# Patient Record
Sex: Female | Born: 1935 | Race: White | Hispanic: No | Marital: Married | Smoking: Never smoker
Health system: Southern US, Community
[De-identification: ages and names within clinical notes are randomized; demographics above are authoritative.]

## PROBLEM LIST (undated history)

## (undated) DIAGNOSIS — Z9889 Other specified postprocedural states: Secondary | ICD-10-CM

## (undated) DIAGNOSIS — Z8679 Personal history of other diseases of the circulatory system: Secondary | ICD-10-CM

## (undated) DIAGNOSIS — I10 Essential (primary) hypertension: Secondary | ICD-10-CM

## (undated) DIAGNOSIS — I495 Sick sinus syndrome: Secondary | ICD-10-CM

## (undated) DIAGNOSIS — D494 Neoplasm of unspecified behavior of bladder: Secondary | ICD-10-CM

## (undated) DIAGNOSIS — L719 Rosacea, unspecified: Secondary | ICD-10-CM

## (undated) HISTORY — DX: Personal history of other diseases of the circulatory system: Z86.79

## (undated) HISTORY — DX: Neoplasm of unspecified behavior of bladder: D49.4

## (undated) HISTORY — PX: ABDOMINAL HYSTERECTOMY: SHX81

## (undated) HISTORY — DX: Sick sinus syndrome: I49.5

## (undated) HISTORY — DX: Rosacea, unspecified: L71.9

## (undated) HISTORY — DX: Essential (primary) hypertension: I10

## (undated) HISTORY — DX: Other specified postprocedural states: Z98.890

---

## 2003-10-13 ENCOUNTER — Other Ambulatory Visit: Payer: Self-pay

## 2004-09-23 ENCOUNTER — Ambulatory Visit: Payer: Self-pay | Admitting: Internal Medicine

## 2007-05-03 ENCOUNTER — Ambulatory Visit: Payer: Self-pay | Admitting: Internal Medicine

## 2007-06-29 HISTORY — PX: PACEMAKER INSERTION: SHX728

## 2007-07-30 DIAGNOSIS — I495 Sick sinus syndrome: Secondary | ICD-10-CM

## 2007-07-30 HISTORY — DX: Sick sinus syndrome: I49.5

## 2007-08-10 ENCOUNTER — Other Ambulatory Visit: Payer: Self-pay

## 2007-08-10 ENCOUNTER — Inpatient Hospital Stay: Payer: Self-pay | Admitting: Internal Medicine

## 2007-08-14 ENCOUNTER — Other Ambulatory Visit: Payer: Self-pay

## 2007-08-15 ENCOUNTER — Other Ambulatory Visit: Payer: Self-pay

## 2008-11-14 ENCOUNTER — Inpatient Hospital Stay: Payer: Self-pay | Admitting: Internal Medicine

## 2009-01-09 ENCOUNTER — Encounter: Payer: Self-pay | Admitting: Internal Medicine

## 2009-01-26 DEATH — deceased

## 2009-02-07 ENCOUNTER — Ambulatory Visit: Payer: Self-pay | Admitting: Internal Medicine

## 2009-02-08 ENCOUNTER — Ambulatory Visit: Payer: Self-pay | Admitting: Internal Medicine

## 2009-02-17 ENCOUNTER — Ambulatory Visit: Payer: Self-pay | Admitting: Internal Medicine

## 2009-03-28 HISTORY — PX: CHOLECYSTECTOMY: SHX55

## 2009-04-24 ENCOUNTER — Inpatient Hospital Stay: Payer: Self-pay | Admitting: Internal Medicine

## 2009-05-09 ENCOUNTER — Encounter: Payer: Self-pay | Admitting: Internal Medicine

## 2009-05-28 ENCOUNTER — Encounter: Payer: Self-pay | Admitting: Internal Medicine

## 2011-01-01 ENCOUNTER — Encounter: Payer: Self-pay | Admitting: Internal Medicine

## 2011-01-27 ENCOUNTER — Encounter: Payer: Self-pay | Admitting: Internal Medicine

## 2011-02-27 ENCOUNTER — Encounter: Payer: Self-pay | Admitting: Internal Medicine

## 2011-03-10 ENCOUNTER — Other Ambulatory Visit: Payer: Self-pay | Admitting: Internal Medicine

## 2011-03-11 MED ORDER — MIRTAZAPINE 15 MG PO TABS: 15.0000 mg | ORAL_TABLET | Freq: Every day | ORAL | Status: DC

## 2011-03-13 ENCOUNTER — Other Ambulatory Visit: Payer: Self-pay | Admitting: Internal Medicine

## 2011-03-13 DIAGNOSIS — I10 Essential (primary) hypertension: Secondary | ICD-10-CM

## 2011-03-16 ENCOUNTER — Other Ambulatory Visit: Payer: Self-pay | Admitting: Internal Medicine

## 2011-03-27 ENCOUNTER — Other Ambulatory Visit: Payer: Self-pay | Admitting: Internal Medicine

## 2011-03-27 DIAGNOSIS — I10 Essential (primary) hypertension: Secondary | ICD-10-CM

## 2011-04-12 ENCOUNTER — Telehealth: Payer: Self-pay | Admitting: Internal Medicine

## 2011-04-12 NOTE — Telephone Encounter (Signed)
Patient would like samples for [place] met she is out or just Januvia with metformin prescription her brother Glendon Axe  will come by to pick up his number is [phone].

## 2011-04-12 NOTE — Telephone Encounter (Signed)
Erie Noe,  Can you get into the habit of checking patient's chart to see if we hae the dose of medication on file that patient is requesting?  It will save [place] days of going back and forth.  This is another patient who has not been abstracted yet so I don't know that dose of the medication she is requesting.  Thanks

## 2011-04-15 ENCOUNTER — Telehealth: Payer: Self-pay | Admitting: *Deleted

## 2011-04-15 NOTE — Telephone Encounter (Signed)
That is because her chart has not been abstracted yet.  This is the second time the patient has called, the first reply went to [place] who is out sick.  Please call the patient back and find out the dose she is taking and we will give her samples

## 2011-04-15 NOTE — Telephone Encounter (Signed)
Brother notified. He will come by and pick up the samples. Samples left up front for pick up.

## 2011-04-15 NOTE — Telephone Encounter (Signed)
Spoke with pt who advised me to call her son (bob). He takes care of pts meds.  Januvia 100 mg  [name] 50/500 mg

## 2011-04-15 NOTE — Telephone Encounter (Signed)
Morrie Sheldon,  Can you call ms Haugen brother [name] and tell him that we have 2 months of Januvia 100 mg samples for Claris Che? One tablet daily.   We do not have enough of the januvia/metformin samples to make his trip worthwhile so she will need to take generic metformin separately; the dose is  500 mg twice daily  #180 with refills, which she may already have at home.    Thanks,.  tt

## 2011-04-15 NOTE — Telephone Encounter (Signed)
Pts brother calling and requesting samples of Januvia or [name]. I do not see this medication on her list please Advise

## 2011-04-21 ENCOUNTER — Encounter: Payer: Self-pay | Admitting: Internal Medicine

## 2011-04-22 ENCOUNTER — Ambulatory Visit (INDEPENDENT_AMBULATORY_CARE_PROVIDER_SITE_OTHER): Payer: Medicare Other | Admitting: Internal Medicine

## 2011-04-22 ENCOUNTER — Encounter: Payer: Self-pay | Admitting: Internal Medicine

## 2011-04-22 DIAGNOSIS — N179 Acute kidney failure, unspecified: Secondary | ICD-10-CM

## 2011-04-22 DIAGNOSIS — E119 Type 2 diabetes mellitus without complications: Secondary | ICD-10-CM

## 2011-04-22 DIAGNOSIS — I959 Hypotension, unspecified: Secondary | ICD-10-CM

## 2011-04-22 DIAGNOSIS — Z23 Encounter for immunization: Secondary | ICD-10-CM

## 2011-04-22 DIAGNOSIS — I10 Essential (primary) hypertension: Secondary | ICD-10-CM

## 2011-04-22 DIAGNOSIS — Z79899 Other long term (current) drug therapy: Secondary | ICD-10-CM

## 2011-04-22 LAB — COMPREHENSIVE METABOLIC PANEL
CO2: 19 mEq/L (ref 19–32)
Creatinine, Ser: 1.6 mg/dL — ABNORMAL HIGH (ref 0.4–1.2)
GFR: 32.56 mL/min — ABNORMAL LOW (ref >60.00)
Glucose, Bld: 230 mg/dL — ABNORMAL HIGH (ref 70–99)
Sodium: 137 mEq/L (ref 135–145)
Total Bilirubin: 0.7 mg/dL (ref 0.3–1.2)
Total Protein: 7.1 g/dL (ref 6.0–8.3)

## 2011-04-22 NOTE — Patient Instructions (Addendum)
Stop the micardis and  continue lisinopril,  Diltiazem and metoprolol.   Resume metoprolol 2 tablets daily starting tomorrow if the top number stays above 150   Try increasing the mritazipine to 1.5 tablet or 2 tablets (if you can't cut them in half) at bedtime to stimulate appetite.

## 2011-04-23 LAB — DIGOXIN LEVEL: Digoxin Level: 0.5 ng/mL — ABNORMAL LOW (ref 0.8–2.0)

## 2011-04-25 ENCOUNTER — Telehealth: Payer: Self-pay | Admitting: Internal Medicine

## 2011-04-25 ENCOUNTER — Encounter: Payer: Self-pay | Admitting: Internal Medicine

## 2011-04-25 DIAGNOSIS — E1165 Type 2 diabetes mellitus with hyperglycemia: Secondary | ICD-10-CM | POA: Insufficient documentation

## 2011-04-25 DIAGNOSIS — N179 Acute kidney failure, unspecified: Secondary | ICD-10-CM | POA: Insufficient documentation

## 2011-04-25 DIAGNOSIS — Z8679 Personal history of other diseases of the circulatory system: Secondary | ICD-10-CM | POA: Insufficient documentation

## 2011-04-25 DIAGNOSIS — I10 Essential (primary) hypertension: Secondary | ICD-10-CM | POA: Insufficient documentation

## 2011-04-25 NOTE — Progress Notes (Signed)
Subjective:    Patient ID: Alexandria Macias, female    DOB: [birth year], 77 y.o.   MRN: [id number]  HPI  Alexandria Macias is a 77 yr od white female with a history of DM , poorly controlled for there past 2 yrs due to patient refusal to use insulin,  Hypertension,  Sick sinus syndrome s.p pacemaker insertion 2009, chronic venous insufficiency with LE edema, who presents for general followup on chronic issues .  She continues ot have fatigeu and deconditioning, light headedness without true vertigo.  She livers at home with her elderly sister and uses a walker occasionally.  No recent falls.  Her brother Alexandria Macias lives nearby and is in constant attendance for trasnportation to all physician visits and grocery shopping, etc.  Patient has refused alternative living situation and home health nursing, so control of diabetes has been difficult since neither she nor her sister will administer insulin or change their diet to a carbohydrate restricted diet.  She relies on samples of januvia due to limited income, but can afford generic medications. Sugars have been running up to 160 in the morning and to 180 in the evening, with no lows reported.  Has been up to date with eye exams.  Contineus to have poor appetite, "nothing tastes good" without sinus pain, congestion or discharge. Past Medical History  Diagnosis Date  . 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 Sick sinus syndrome Feb 2009    pacer implant  . Hx of cardiac catheterization     right and left heart  . Rosacea   . Hypertension   . Diabetes mellitus   . History of sick sinus syndrome    Current Outpatient Prescriptions on File Prior to Visit  Medication Sig Dispense Refill  . digoxin (LANOXIN) 0.125 MG tablet Take 125 mcg by mouth every other day.        . diltiazem (CARDIZEM CD) 180 MG 24 hr capsule Take 360 mg by mouth daily.        Marland Kitchen glipiZIDE (GLUCOTROL) 10 MG tablet Take 10 mg by mouth 2 (two) times daily before a meal.        .  lisinopril (PRINIVIL,ZESTRIL) 40 MG tablet Take 40 mg by mouth daily.        . metoprolol (TOPROL-XL) 100 MG 24 hr tablet TAKE TWO TABLETS BY MOUTH EVERY DAY IN THE MORNING FOR BLOOD PRESSURE  60 tablet  5  . MICARDIS 80 MG tablet TAKE ONE TABLET BY MOUTH EVERY DAY  30 each  5  . mirtazapine (REMERON) 15 MG tablet Take 1 tablet (15 mg total) by mouth at bedtime.  30 tablet  3    .  Review of Systems  Constitutional: Positive for appetite change and fatigue.  HENT: Negative.   Eyes: Negative.   Respiratory: Negative.   Cardiovascular: Positive for leg swelling.  Gastrointestinal: Negative.   Genitourinary: Negative.   Neurological: Positive for weakness and light-headedness. Negative for tremors, syncope and speech difficulty.  Hematological: Negative.   Psychiatric/Behavioral: Negative.       BP 106/60  Pulse 97  Temp(Src) 98.2 F (36.8 C) (Oral)  Resp 16  Ht 5' 3.5" (1.613 m)  Wt 144 lb 4 oz (65.431 kg)  BMI 25.15 kg/m2  SpO2 97%  Objective:   Physical Exam  Constitutional: She is oriented to person, place, and time. She appears well-developed and well-nourished.  HENT:  Mouth/Throat: Oropharynx is clear and moist.  Eyes: EOM are normal.  Pupils are equal, round, and reactive to light. No scleral icterus.  Neck: Normal range of motion. Neck supple. No JVD present. No thyromegaly present.  Cardiovascular: Normal rate, regular rhythm, normal heart sounds and intact distal pulses.   Pulmonary/Chest: Effort normal and breath sounds normal.  Abdominal: Soft. Bowel sounds are normal. She exhibits no mass. There is no tenderness.  Musculoskeletal: Normal range of motion. She exhibits edema.  Lymphadenopathy:    She has no cervical adenopathy.  Neurological: She is alert and oriented to person, place, and time.  Skin: Skin is warm and dry.  Psychiatric: She has a normal mood and affect.          Assessment & Plan:

## 2011-04-25 NOTE — Telephone Encounter (Signed)
pls let Alexandria Macias known that Alexandria Macias's kidney function is down by recent labs,  I want her to stop the micardis and lisinopril for two weeks and return for blood work in 2 weeks.

## 2011-04-25 NOTE — Assessment & Plan Note (Signed)
With bump in cr from 0.8 previously.  Will stop the micardis and repeat a BMET in 2 weeks.

## 2011-04-25 NOTE — Assessment & Plan Note (Addendum)
suboptimal control on januvia, glipizide and metformin, but improved from prior hgba1c of 12 in January.  Patient and family refuse to use insulin or any medication requiring needles.  hgba1c due,  samples given. Reminded to continue annual eye exams.

## 2011-04-25 NOTE — Assessment & Plan Note (Addendum)
She appears to be over medicaed currently, abd her cr is elevated from prior Cr of 0.8 in January .  Will stop the micardis for now and monitor home pressures.  Repeat Cr in 3 months

## 2011-04-26 NOTE — Telephone Encounter (Signed)
Notified Alexandria Macias of the message he will bring patient back in 2 weeks for additional labs.

## 2011-05-22 ENCOUNTER — Other Ambulatory Visit: Payer: Self-pay | Admitting: Internal Medicine

## 2011-06-01 ENCOUNTER — Telehealth: Payer: Self-pay | Admitting: Internal Medicine

## 2011-06-01 MED ORDER — MIRTAZAPINE 45 MG PO TABS: ORAL_TABLET | ORAL | Status: AC

## 2011-06-01 NOTE — Telephone Encounter (Signed)
Patient's brother notified   

## 2011-06-01 NOTE — Telephone Encounter (Signed)
It was in the patient instructions.  Chart now updated correctly.   I sent a new rx for 45 mg tablet : 1/2 tablet daily  At bedtime to pharmacy via  e prescribe

## 2011-06-01 NOTE — Telephone Encounter (Signed)
Patients brother called and stated at patients last office you increased her mirtazepine dose to one and a half tablets a day.  He stated she is out of the medication because she only had a 30 day supply.  I did not see in her chart where you increased the medication.  Please advise.

## 2011-06-02 ENCOUNTER — Encounter: Payer: Self-pay | Admitting: Internal Medicine

## 2011-07-10 ENCOUNTER — Other Ambulatory Visit: Payer: Self-pay | Admitting: Internal Medicine

## 2011-07-17 ENCOUNTER — Other Ambulatory Visit: Payer: Self-pay | Admitting: Internal Medicine

## 2011-07-22 ENCOUNTER — Encounter: Payer: Self-pay | Admitting: Internal Medicine

## 2011-07-22 ENCOUNTER — Ambulatory Visit (INDEPENDENT_AMBULATORY_CARE_PROVIDER_SITE_OTHER): Payer: Medicare Other | Admitting: Internal Medicine

## 2011-07-22 DIAGNOSIS — E875 Hyperkalemia: Secondary | ICD-10-CM

## 2011-07-22 DIAGNOSIS — I10 Essential (primary) hypertension: Secondary | ICD-10-CM

## 2011-07-22 DIAGNOSIS — E119 Type 2 diabetes mellitus without complications: Secondary | ICD-10-CM

## 2011-07-22 DIAGNOSIS — Z8679 Personal history of other diseases of the circulatory system: Secondary | ICD-10-CM

## 2011-07-22 DIAGNOSIS — R5383 Other fatigue: Secondary | ICD-10-CM

## 2011-07-22 LAB — COMPREHENSIVE METABOLIC PANEL
ALT: 17 U/L (ref 0–35)
AST: 18 U/L (ref 0–37)
Alkaline Phosphatase: 71 U/L (ref 39–117)
Calcium: 9.5 mg/dL (ref 8.4–10.5)
Chloride: 105 mEq/L (ref 96–112)
Creatinine, Ser: 1.4 mg/dL — ABNORMAL HIGH (ref 0.4–1.2)

## 2011-07-22 LAB — CBC WITH DIFFERENTIAL/PLATELET
Basophils Relative: 0.3 % (ref 0.0–3.0)
Eosinophils Absolute: 0.1 10*3/uL (ref 0.0–0.7)
Eosinophils Relative: 1.7 % (ref 0.0–5.0)
HCT: 37.8 % (ref 36.0–46.0)
Hemoglobin: 12.6 g/dL (ref 12.0–15.0)
MCHC: 33.4 g/dL (ref 30.0–36.0)
MCV: 93.1 fl (ref 78.0–100.0)
Monocytes Absolute: 0.5 10*3/uL (ref 0.1–1.0)
Neutro Abs: 6.2 10*3/uL (ref 1.4–7.7)
RBC: 4.07 Mil/uL (ref 3.87–5.11)
WBC: 8.1 10*3/uL (ref 4.5–10.5)

## 2011-07-22 NOTE — Progress Notes (Signed)
Subjective:    Patient ID: Alexandria Macias, female    DOB: 01/05/1918, 78 y.o.   MRN: [id number]  HPI  Alexandria Macias is a delightful 78 year old white female with a history of diabetes mellitus hypertension and atrial fibrillation. She continues to live at home with her sister Alexandria Macias. She continues to drive but has route recently decided to [employer] driver's driving license because she has noted that she becomes confused at times when she is driving and has trouble remembering how to get a certain places. She does not having difficulty going back and forth to the drug store or to the grocery store. She's had no recent accidents and her her brother Alexandria Macias confer Z. there are no new dense on her car. Today she feels tired and thinks that she has mild dehydration. At her last visit she was urged to increase her fluid intake and several her blood pressure medications were discontinued at that time. Her brother Alexandria Macias notes that since we discontinued her blood pressure medications she has done much better and has had more energy and felt less worn out.    Past Medical History  Diagnosis Date  . 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 Sick sinus syndrome Feb 2009    pacer implant  . Hx of cardiac catheterization     right and left heart  . Rosacea   . Hypertension   . Diabetes mellitus   . History of sick sinus syndrome    Current Outpatient Prescriptions on File Prior to Visit  Medication Sig Dispense Refill  . digoxin (LANOXIN) 0.125 MG tablet Take 125 mcg by mouth every other day.        . diltiazem (CARDIZEM CD) 180 MG 24 hr capsule TAKE TWO CAPSULES BY MOUTH EVERY DAY FOR HEART RATE  180 capsule  3  . glipiZIDE (GLUCOTROL) 10 MG tablet TAKE ONE TABLET BY MOUTH TWICE DAILY WITH MEALS  60 tablet  6  . metFORMIN (GLUCOPHAGE) 500 MG tablet TAKE ONE TABLET BY MOUTH TWICE DAILY  60 tablet  11  . mirtazapine (REMERON) 45 MG tablet 1/2  tablet daily at bedtime  30 tablet  3  . sitaGLIPtin  (JANUVIA) 100 MG tablet Take 100 mg by mouth daily.        . metoprolol (TOPROL-XL) 100 MG 24 hr tablet TAKE TWO TABLETS BY MOUTH EVERY DAY IN THE MORNING FOR BLOOD PRESSURE  60 tablet  5    Review of Systems  Constitutional: Positive for appetite change and fatigue.  HENT: Negative.   Eyes: Negative.   Respiratory: Negative.   Cardiovascular: Positive for leg swelling.  Gastrointestinal: Positive for abdominal distention.  Genitourinary: Negative.   Neurological: Positive for weakness. Negative for tremors, syncope, speech difficulty and light-headedness.  Hematological: Negative.   Psychiatric/Behavioral: Negative.        Objective:   Physical Exam  Constitutional: She is oriented to person, place, and time. She appears well-developed and well-nourished.  HENT:  Mouth/Throat: Oropharynx is clear and moist.  Eyes: EOM are normal. Pupils are equal, round, and reactive to light. No scleral icterus.  Neck: Normal range of motion. Neck supple. No JVD present. No thyromegaly present.  Cardiovascular: Normal rate, regular rhythm, normal heart sounds and intact distal pulses.   Pulmonary/Chest: Effort normal and breath sounds normal.  Abdominal: Soft. Bowel sounds are normal. She exhibits no mass. There is no tenderness.  Musculoskeletal: Normal range of motion. She exhibits no edema.  Lymphadenopathy:  She has no cervical adenopathy.  Neurological: She is alert and oriented to person, place, and time.  Skin: Skin is warm and dry.  Psychiatric: She has a normal mood and affect.      Assessment & Plan:   Hypertension At her last visit her Lorcet losartan and lisinopril were discontinued because of hypotension and hyperkalemia. Repeat blood pressure stays high and she remains slightly hyperkalemic but is on no medications that can be causing this. Renal function is stable. No changes today  History of sick sinus syndrome Her heart rate remains well controlled on current regimen.  She is status post pacemaker for sick sinus syndrome.  Diabetes mellitus Her diabetes remains uncontrolled due to her inability to self and entered in his insulin. We have decided to stop checking hemoglobin A1c use since there are no plans to start insulin.    Updated Medication List Outpatient Encounter Prescriptions as of 07/22/2011  Medication Sig Dispense Refill  . digoxin (LANOXIN) 0.125 MG tablet Take 125 mcg by mouth every other day.        . diltiazem (CARDIZEM CD) 180 MG 24 hr capsule TAKE TWO CAPSULES BY MOUTH EVERY DAY FOR HEART RATE  180 capsule  3  . glipiZIDE (GLUCOTROL) 10 MG tablet TAKE ONE TABLET BY MOUTH TWICE DAILY WITH MEALS  60 tablet  6  . metFORMIN (GLUCOPHAGE) 500 MG tablet TAKE ONE TABLET BY MOUTH TWICE DAILY  60 tablet  11  . mirtazapine (REMERON) 45 MG tablet 1/2  tablet daily at bedtime  30 tablet  3  . sitaGLIPtin (JANUVIA) 100 MG tablet Take 100 mg by mouth daily.        . metoprolol (TOPROL-XL) 100 MG 24 hr tablet TAKE TWO TABLETS BY MOUTH EVERY DAY IN THE MORNING FOR BLOOD PRESSURE  60 tablet  5  . DISCONTD: lisinopril (PRINIVIL,ZESTRIL) 40 MG tablet Take 40 mg by mouth daily.        Marland Kitchen DISCONTD: MICARDIS 80 MG tablet TAKE ONE TABLET BY MOUTH EVERY DAY  30 each  5

## 2011-07-23 NOTE — Assessment & Plan Note (Signed)
At her last visit her Lorcet losartan and lisinopril were discontinued because of hypotension and hyperkalemia. Repeat blood pressure stays high and she remains slightly hyperkalemic but is on no medications that can be causing this. Renal function is stable. No changes today

## 2011-07-23 NOTE — Assessment & Plan Note (Signed)
Her heart rate remains well controlled on current regimen. She is status post pacemaker for sick sinus syndrome.

## 2011-07-23 NOTE — Assessment & Plan Note (Signed)
Her diabetes remains uncontrolled due to her inability to self and entered in his insulin. We have decided to stop checking hemoglobin A1c use since there are no plans to start insulin.

## 2011-08-18 ENCOUNTER — Other Ambulatory Visit: Payer: Self-pay | Admitting: Internal Medicine

## 2011-11-26 ENCOUNTER — Encounter: Payer: Self-pay | Admitting: Internal Medicine

## 2011-11-26 ENCOUNTER — Ambulatory Visit (INDEPENDENT_AMBULATORY_CARE_PROVIDER_SITE_OTHER): Payer: Medicare Other | Admitting: Internal Medicine

## 2011-11-26 VITALS — BP 142/64 | HR 92 | Temp 97.9°F | Resp 14 | Wt 150.5 lb

## 2011-11-26 DIAGNOSIS — E119 Type 2 diabetes mellitus without complications: Secondary | ICD-10-CM

## 2011-11-26 DIAGNOSIS — IMO0002 Reserved for concepts with insufficient information to code with codable children: Secondary | ICD-10-CM

## 2011-11-26 DIAGNOSIS — E1165 Type 2 diabetes mellitus with hyperglycemia: Secondary | ICD-10-CM

## 2011-11-26 DIAGNOSIS — IMO0001 Reserved for inherently not codable concepts without codable children: Secondary | ICD-10-CM

## 2011-11-26 DIAGNOSIS — R350 Frequency of micturition: Secondary | ICD-10-CM

## 2011-11-26 DIAGNOSIS — E559 Vitamin D deficiency, unspecified: Secondary | ICD-10-CM

## 2011-11-26 DIAGNOSIS — N189 Chronic kidney disease, unspecified: Secondary | ICD-10-CM

## 2011-11-26 DIAGNOSIS — E1122 Type 2 diabetes mellitus with diabetic chronic kidney disease: Secondary | ICD-10-CM

## 2011-11-26 DIAGNOSIS — N058 Unspecified nephritic syndrome with other morphologic changes: Secondary | ICD-10-CM

## 2011-11-26 DIAGNOSIS — R5383 Other fatigue: Secondary | ICD-10-CM

## 2011-11-26 DIAGNOSIS — R5381 Other malaise: Secondary | ICD-10-CM

## 2011-11-26 DIAGNOSIS — E1129 Type 2 diabetes mellitus with other diabetic kidney complication: Secondary | ICD-10-CM

## 2011-11-26 DIAGNOSIS — E538 Deficiency of other specified B group vitamins: Secondary | ICD-10-CM

## 2011-11-26 LAB — POCT URINALYSIS DIPSTICK
Protein, UA: 30
Spec Grav, UA: 1.025
Urobilinogen, UA: 0.2

## 2011-11-26 MED ORDER — LINAGLIPTIN-METFORMIN HCL 2.5-1000 MG PO TABS: 1.0000 | ORAL_TABLET | Freq: Every day | ORAL | Status: DC

## 2011-11-26 MED ORDER — CYANOCOBALAMIN 1000 MCG/ML IJ SOLN
1000.0000 ug | Freq: Once | INTRAMUSCULAR | Status: AC
  Administered 2011-11-26: 1000 ug via INTRAMUSCULAR

## 2011-11-26 NOTE — Patient Instructions (Signed)
I have given you samples of a medicine  To take for your diabetes when you run out of the [place].  The new medicine already has the metformin in it , so stop your metformin.

## 2011-11-26 NOTE — Progress Notes (Signed)
Patient ID: Alexandria Macias, female   DOB: 12-08-1917, 78 y.o.   MRN: [id number] Patient Active Problem List  Diagnoses  . Hypertension  . Diabetes mellitus type 2, uncontrolled  . History of sick sinus syndrome  . Acute renal failure  . Fatigue  . Chronic kidney disease in type 2 diabetes mellitus    Subjective:  CC:   Chief Complaint  Patient presents with  . Follow-up    HPI:   Alexandria Macias a 78 y.o. female who presents  For follow up on diabetes .  She continues to live at home with her sister and husband. Reports excessive fatigue, worried she has a thyroid disease since she has noticed her fingernails becoming brittle and breaking easily, along with hair loss, weakness, dry skin.  Her blood sugars have been elevated lately in the 220 to 300 range , checked once daily .  No lows,,  Using only oral medications.  Past Medical History  Diagnosis Date  . 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 Sick sinus syndrome Feb 2009    pacer implant  . Hx of cardiac catheterization     right and left heart  . Rosacea   . Hypertension   . Diabetes mellitus   . History of sick sinus syndrome     Past Surgical History  Procedure Date  . Abdominal hysterectomy   . Pacemaker insertion 2009  . Cholecystectomy Oct 2010    chronic cholecystitis         The following portions of the patient's history were reviewed and updated as appropriate: Allergies, current medications, and problem list.    Review of Systems:   12 Pt  review of systems was negative except those addressed in the HPI,     History   Social History  . Marital Status: Widowed    Spouse Name: N/A    Number of Children: N/A  . Years of Education: N/A   Occupational History  . Not on file.   Social History Main Topics  . Smoking status: Never Smoker   . Smokeless tobacco: Never Used  . Alcohol Use: No  . Drug Use: No  . Sexually Active: Not on file   Other Topics Concern  . Not  on file   Social History Narrative  . No narrative on file    Objective:  BP 142/64  Pulse 92  Temp(Src) 97.9 F (36.6 C) (Oral)  Resp 14  Wt 150 lb 8 oz (68.266 kg)  SpO2 97%  General appearance: alert, cooperative and appears stated age Ears: normal TM's and external ear canals both ears Throat: lips, mucosa, and tongue normal; teeth and gums normal Neck: no adenopathy, no carotid bruit, supple, symmetrical, trachea midline and thyroid not enlarged, symmetric, no tenderness/mass/nodules Back: symmetric, no curvature. ROM normal. No CVA tenderness. Lungs: clear to auscultation bilaterally Heart: regular rate and rhythm, S1, S2 normal, no murmur, click, rub or gallop Abdomen: soft, non-tender; bowel sounds normal; no masses,  no organomegaly Pulses: 2+ and symmetric Skin: Skin color, texture, turgor normal. No rashes or lesions Lymph nodes: Cervical, supraclavicular, and axillary nodes normal.  Assessment and Plan:  Fatigue With other signs of hypothyroidism but normal TSH in the past.  Will repeat per patient request.   Diabetes mellitus type 2, uncontrolled suboptimal control secondary to inability to administer insulin.  She is on maximal doses of metformin and glipizide,  The Januvia is costing over $100 er month.  Sample  of an alternative gliptin given today , 3 month supply.   Chronic kidney disease in type 2 diabetes mellitus Stable function by repeat labs.     Updated Medication List Outpatient Encounter Prescriptions as of 11/26/2011  Medication Sig Dispense Refill  . digoxin (LANOXIN) 0.125 MG tablet Take 125 mcg by mouth every other day.        . diltiazem (CARDIZEM CD) 180 MG 24 hr capsule TAKE TWO CAPSULES BY MOUTH EVERY DAY FOR HEART RATE  180 capsule  3  . glipiZIDE (GLUCOTROL) 10 MG tablet TAKE ONE TABLET BY MOUTH TWICE DAILY WITH MEALS  60 tablet  6  . mirtazapine (REMERON) 45 MG tablet 1/2  tablet daily at bedtime  30 tablet  3  . DISCONTD: JANUVIA 100  MG tablet TAKE ONE TABLET BY MOUTH EVERY DAY  30 each  6  . DISCONTD: metFORMIN (GLUCOPHAGE) 500 MG tablet TAKE ONE TABLET BY MOUTH TWICE DAILY  60 tablet  11  . Linagliptin-Metformin HCl (JENTADUETO) 2.10-998 MG TABS Take 1 tablet by mouth daily.  90 tablet  0  . DISCONTD: metoprolol (TOPROL-XL) 100 MG 24 hr tablet TAKE TWO TABLETS BY MOUTH EVERY DAY IN THE MORNING FOR BLOOD PRESSURE  60 tablet  5  . cyanocobalamin ((VITAMIN B-12)) injection 1,000 mcg          Orders Placed This Encounter  Procedures  . Urine Culture  . Comp Met (CMET)  . TSH  . Hemoglobin A1c  . CBC with Differential  . Magnesium  . Vitamin D 25 hydroxy  . Digoxin level  . POCT urinalysis dipstick    No Follow-up on file.

## 2011-11-27 LAB — CBC WITH DIFFERENTIAL/PLATELET
Basophils Absolute: 0 10*3/uL (ref 0.0–0.1)
Basophils Relative: 0 % (ref 0–1)
Eosinophils Absolute: 0.1 10*3/uL (ref 0.0–0.7)
Eosinophils Relative: 1 % (ref 0–5)
HCT: 40.1 % (ref 36.0–46.0)
MCH: 30.2 pg (ref 26.0–34.0)
MCHC: 32.9 g/dL (ref 30.0–36.0)
MCV: 91.8 fL (ref 78.0–100.0)
Monocytes Absolute: 0.6 10*3/uL (ref 0.1–1.0)
Platelets: 207 10*3/uL (ref 150–400)
RDW: 14.1 % (ref 11.5–15.5)
WBC: 7.9 10*3/uL (ref 4.0–10.5)

## 2011-11-27 LAB — COMPREHENSIVE METABOLIC PANEL
ALT: 13 U/L (ref 0–35)
AST: 21 U/L (ref 0–37)
Albumin: 4.1 g/dL (ref 3.5–5.2)
CO2: 17 mEq/L — ABNORMAL LOW (ref 19–32)
Calcium: 9.9 mg/dL (ref 8.4–10.5)
Chloride: 101 mEq/L (ref 96–112)
Creat: 1.51 mg/dL — ABNORMAL HIGH (ref 0.50–1.10)
Potassium: 4.9 mEq/L (ref 3.5–5.3)

## 2011-11-27 LAB — TSH: TSH: 3.85 u[IU]/mL (ref 0.350–4.500)

## 2011-11-28 ENCOUNTER — Encounter: Payer: Self-pay | Admitting: Internal Medicine

## 2011-11-28 DIAGNOSIS — E1122 Type 2 diabetes mellitus with diabetic chronic kidney disease: Secondary | ICD-10-CM | POA: Insufficient documentation

## 2011-11-28 DIAGNOSIS — R5383 Other fatigue: Secondary | ICD-10-CM | POA: Insufficient documentation

## 2011-11-28 LAB — URINE CULTURE
Colony Count: NO GROWTH
Organism ID, Bacteria: NO GROWTH

## 2011-11-28 NOTE — Assessment & Plan Note (Signed)
With other signs of hypothyroidism but normal TSH in the past.  Will repeat per patient request.

## 2011-11-28 NOTE — Assessment & Plan Note (Signed)
Stable function by repeat labs.

## 2011-11-28 NOTE — Assessment & Plan Note (Addendum)
suboptimal control secondary to inability to administer insulin.  She is on maximal doses of metformin and glipizide,  The Januvia is costing over $100 er month.  Sample of an alternative gliptin given today , 3 month supply.

## 2011-11-30 NOTE — Progress Notes (Signed)
Addended by: Duncan Dull on: 11/30/2011 01:19 PM   Modules accepted: Orders

## 2011-12-06 ENCOUNTER — Telehealth: Payer: Self-pay | Admitting: Internal Medicine

## 2011-12-06 NOTE — Telephone Encounter (Signed)
Selena Batten is scheduled to see patient this Friday 12/10/11 at 10:00.

## 2011-12-31 ENCOUNTER — Telehealth: Payer: Self-pay | Admitting: Internal Medicine

## 2011-12-31 NOTE — Telephone Encounter (Signed)
[name] has two choices,  He can increase the jentaduetto to two tablets daily,  Or resume the Januvia once daily and add the metformin back twice daily

## 2011-12-31 NOTE — Telephone Encounter (Signed)
I spoke with [name] he is going to give patient the Jentadueto two tablets daily.

## 2011-12-31 NOTE — Telephone Encounter (Signed)
Caller: Bob/Sibling; Phone Number: [phone]; Message from caller: Brother calling today 12/31/11 regarding Dr.  Darrick Huntsman changed his sister's blood sugar medicine on 12/19/11 to [facility], since that time her blood sugars have been running about 100 more than normal.  Has been as high as 354 - 2 days ago.  Brother has taken her off this and put her back on the Januvia 100 mg once per day.  Has not been taking the Metformin 500 mg BID.  Wants to know if she should go back on the Metformin or what Dr.  Darrick Huntsman advises from here.  Blood sugar this AM was 260 which was 3 hours after breakfast.  PLEASE CALL BROTHER BACK AT [phone] TO ADVISE.

## 2012-01-08 ENCOUNTER — Other Ambulatory Visit: Payer: Self-pay | Admitting: Internal Medicine

## 2012-02-12 ENCOUNTER — Other Ambulatory Visit: Payer: Self-pay | Admitting: Internal Medicine

## 2012-04-29 ENCOUNTER — Other Ambulatory Visit: Payer: Self-pay | Admitting: Internal Medicine

## 2012-05-02 NOTE — Telephone Encounter (Signed)
Refill request for Januvia 100 mg # 30 1 R was sent to [facility] pharmacy patient was called and advised to make a follow up appt for Diabetes.

## 2012-05-13 ENCOUNTER — Other Ambulatory Visit: Payer: Self-pay | Admitting: Internal Medicine

## 2012-05-15 NOTE — Telephone Encounter (Signed)
Reordered Cardizem CD 180 mg per [employer]

## 2012-07-01 ENCOUNTER — Other Ambulatory Visit: Payer: Self-pay | Admitting: Internal Medicine

## 2012-07-07 ENCOUNTER — Ambulatory Visit (INDEPENDENT_AMBULATORY_CARE_PROVIDER_SITE_OTHER): Payer: Medicare Other | Admitting: Internal Medicine

## 2012-07-07 ENCOUNTER — Telehealth: Payer: Self-pay | Admitting: *Deleted

## 2012-07-07 ENCOUNTER — Encounter: Payer: Self-pay | Admitting: Internal Medicine

## 2012-07-07 VITALS — BP 136/84 | HR 89 | Temp 97.9°F | Resp 16 | Wt 146.5 lb

## 2012-07-07 DIAGNOSIS — E1129 Type 2 diabetes mellitus with other diabetic kidney complication: Secondary | ICD-10-CM

## 2012-07-07 DIAGNOSIS — K5900 Constipation, unspecified: Secondary | ICD-10-CM

## 2012-07-07 DIAGNOSIS — R634 Abnormal weight loss: Secondary | ICD-10-CM

## 2012-07-07 DIAGNOSIS — Z1331 Encounter for screening for depression: Secondary | ICD-10-CM

## 2012-07-07 DIAGNOSIS — Z8679 Personal history of other diseases of the circulatory system: Secondary | ICD-10-CM

## 2012-07-07 DIAGNOSIS — I10 Essential (primary) hypertension: Secondary | ICD-10-CM

## 2012-07-07 DIAGNOSIS — IMO0001 Reserved for inherently not codable concepts without codable children: Secondary | ICD-10-CM

## 2012-07-07 DIAGNOSIS — E559 Vitamin D deficiency, unspecified: Secondary | ICD-10-CM

## 2012-07-07 DIAGNOSIS — E1165 Type 2 diabetes mellitus with hyperglycemia: Secondary | ICD-10-CM

## 2012-07-07 DIAGNOSIS — IMO0002 Reserved for concepts with insufficient information to code with codable children: Secondary | ICD-10-CM

## 2012-07-07 DIAGNOSIS — E1122 Type 2 diabetes mellitus with diabetic chronic kidney disease: Secondary | ICD-10-CM

## 2012-07-07 DIAGNOSIS — N189 Chronic kidney disease, unspecified: Secondary | ICD-10-CM

## 2012-07-07 LAB — COMPREHENSIVE METABOLIC PANEL
ALT: 13 U/L (ref 0–35)
AST: 18 U/L (ref 0–37)
Albumin: 3.8 g/dL (ref 3.5–5.2)
Calcium: 9.4 mg/dL (ref 8.4–10.5)
Chloride: 102 mEq/L (ref 96–112)
Creatinine, Ser: 1.3 mg/dL — ABNORMAL HIGH (ref 0.4–1.2)
Potassium: 4.3 mEq/L (ref 3.5–5.1)
Sodium: 134 mEq/L — ABNORMAL LOW (ref 135–145)
Total Protein: 7.2 g/dL (ref 6.0–8.3)

## 2012-07-07 LAB — POCT URINALYSIS DIPSTICK
Bilirubin, UA: NEGATIVE
Leukocytes, UA: NEGATIVE
Nitrite, UA: NEGATIVE
pH, UA: 5

## 2012-07-07 LAB — MICROALBUMIN / CREATININE URINE RATIO: Microalb, Ur: 19.2 mg/dL — ABNORMAL HIGH (ref 0.0–1.9)

## 2012-07-07 LAB — MAGNESIUM: Magnesium: 1.5 mg/dL (ref 1.5–2.5)

## 2012-07-07 MED ORDER — METFORMIN HCL 1000 MG PO TABS: 1000.0000 mg | ORAL_TABLET | Freq: Two times a day (BID) | ORAL | Status: DC

## 2012-07-07 MED ORDER — GLIPIZIDE 10 MG PO TABS: 10.0000 mg | ORAL_TABLET | Freq: Two times a day (BID) | ORAL | Status: DC

## 2012-07-07 MED ORDER — SITAGLIPTIN PHOSPHATE 100 MG PO TABS: 100.0000 mg | ORAL_TABLET | Freq: Every day | ORAL | Status: DC

## 2012-07-07 NOTE — Patient Instructions (Addendum)
Please drink 3 16 ounce servings of water or flavored water daily  You need 25 g fiber daily. You can use miralax every day to keep your bowels.   Please resume the [place] once daily.  When you run out of samples, call the officre and as for Dr. Benjaman Pott to make a substitution  Bring by the formulary so I can see if insurance is going  To cover anything like [place]  Your skin would benefit from a change in moisturizer to Eucerin and you can use it twice daily

## 2012-07-07 NOTE — Telephone Encounter (Signed)
For this pt would you like a urine culture done? Thank you 

## 2012-07-07 NOTE — Telephone Encounter (Signed)
Not necessary.

## 2012-07-07 NOTE — Progress Notes (Signed)
Patient ID: Alexandria Macias, female   DOB: 06/30/1917, 79 y.o.   MRN: [id number]   Patient Active Problem List  Diagnosis  . Hypertension  . Diabetes mellitus type 2, uncontrolled  . History of sick sinus syndrome  . Fatigue  . Chronic kidney disease in type 2 diabetes mellitus  . Constipation  . Vitamin D deficiency  . Hypomagnesemia    Subjective:  CC:   Chief Complaint  Patient presents with  . Follow-up    Diabetes    HPI:   Alexandria Macias a 79 y.o. female who presents Six-month followup on diabetes, atrial fibrillation and other issues.. She is accompanied by her brother [name]. 1) anorexia. She continues to have decreased appetite and states that everything tastes bad. She denies sinus congestion and sinus pain and trouble swallowing. Her symptoms been present since her gallbladder was taken out several years ago. 2 Diabetes mellitus. Her sugars have been running in the 200-270 range. She is dependent on samples of Januvia when available. She has refused to self administer insulin. He's had no recent hypoglycemic events. She's had no recent history of falls. She does have frequent bouts of constipation and is not taking a fiber supplement daily. She has lost another 4 pounds since may . 3) one month history of pruritis involving ankles with aggravation of symptoms at night.,  No dogs or cats in the house.  No recent changes in detergents or moisturizers. No medication changes recently.   Past Medical History  Diagnosis Date  . 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 Sick sinus syndrome Feb 2009    pacer implant  . Hx of cardiac catheterization     right and left heart  . Rosacea   . Hypertension   . Diabetes mellitus   . History of sick sinus syndrome     Past Surgical History  Procedure Date  . Abdominal hysterectomy   . Pacemaker insertion 2009  . Cholecystectomy Oct 2010    chronic cholecystitis         The following portions of the  patient's history were reviewed and updated as appropriate: Allergies, current medications, and problem list.    Review of Systems:  Patient denies headache, fevers, malaise,eye pain, sinus congestion and sinus pain, sore throat, dysphagia,  hemoptysis , cough, dyspnea, wheezing, chest pain, palpitations, orthopnea, edema, abdominal pain, nausea, melena, diarrhea, constipation, flank pain, dysuria, hematuria, urinary  Frequency, nocturia, numbness, tingling, seizures,  Focal weakness, Loss of consciousness,  Tremor, insomnia, depression, anxiety, and suicidal ideation.       History   Social History  . Marital Status: Widowed    Spouse Name: N/A    Number of Children: N/A  . Years of Education: N/A   Occupational History  . Not on file.   Social History Main Topics  . Smoking status: Never Smoker   . Smokeless tobacco: Never Used  . Alcohol Use: No  . Drug Use: No  . Sexually Active: Not on file   Other Topics Concern  . Not on file   Social History Narrative  . No narrative on file    Objective:  BP 136/84  Pulse 89  Temp 97.9 F (36.6 C) (Oral)  Resp 16  Wt 146 lb 8 oz (66.452 kg)  SpO2 99%  General appearance: alert, cooperative and appears stated age Ears: normal TM's and external ear canals both ears Throat: lips, mucosa, and tongue normal; teeth and gums normal Neck: no  adenopathy, no carotid bruit, supple, symmetrical, trachea midline and thyroid not enlarged, symmetric, no tenderness/mass/nodules Back: symmetric, no curvature. ROM normal. No CVA tenderness. Lungs: clear to auscultation bilaterally Heart: irreg irreg, S1, S2 normal, no murmur, click, rub or gallop Abdomen: soft, non-tender; bowel sounds normal; no masses,  no organomegaly Pulses: 2+ and symmetric Skin:  Xerosis noted. Mild band of erythema around left ankle. no lesions Lymph nodes: Cervical, supraclavicular, and axillary nodes normal.  Assessment and Plan:  Diabetes mellitus type 2,  uncontrolled Her last hemoglobin A1c was 10.6. We are no longer checking them given her age and inability to use insulin and therefore achieve good glycemic control. Continue glipizide, metformin, and samples of Januvia x2 months given to patient today.  Chronic kidney disease in type 2 diabetes mellitus Creatinine is stable. Electrolytes are normal.  History of sick sinus syndrome Heart rate is well controlled today on current medications. No changes. She denies orthopnea and dyspnea with daily exertion  Hypertension Well-controlled on current medications.  Constipation Recommended daily use of a fiber supplement such as MiraLAX. Also recommended to aim for 316 ounce glasses of water daily.  Vitamin D deficiency Will prescribe Drisdol 50,000 units weekly for the winter months.  Hypomagnesemia Secondary to poor by mouth intake. Supplements prescribed.   Updated Medication List Outpatient Encounter Prescriptions as of 07/07/2012  Medication Sig Dispense Refill  . digoxin (LANOXIN) 0.125 MG tablet TAKE ONE TABLET BY MOUTH EVERY OTHER DAY  30 tablet  5  . diltiazem (CARDIZEM CD) 180 MG 24 hr capsule TAKE TWO CAPSULES BY MOUTH EVERY DAY FOR HEART RATE  180 capsule  3  . glipiZIDE (GLUCOTROL) 10 MG tablet Take 1 tablet (10 mg total) by mouth 2 (two) times daily before a meal.  90 tablet  3  . sitaGLIPtin (JANUVIA) 100 MG tablet Take 1 tablet (100 mg total) by mouth daily.  60 tablet  0  . [DISCONTINUED] glipiZIDE (GLUCOTROL) 10 MG tablet TAKE ONE TABLET BY MOUTH TWICE DAILY WITH MEALS  60 tablet  11  . [DISCONTINUED] JANUVIA 100 MG tablet TAKE ONE TABLET BY MOUTH EVERY DAY  30 tablet  0  . [DISCONTINUED] Linagliptin-Metformin HCl (JENTADUETO) 2.10-998 MG TABS Take 1 tablet by mouth daily.  90 tablet  0  . [DISCONTINUED] metFORMIN (GLUCOPHAGE) 500 MG tablet       . ergocalciferol (DRISDOL) 50000 UNITS capsule Take 1 capsule (50,000 Units total) by mouth once a week. 90 days supply: take  with food  12 capsule  0  . magnesium oxide (MAG-OX 400) 400 MG tablet Take 1 tablet (400 mg total) by mouth 2 (two) times daily.  180 tablet  3  . metFORMIN (GLUCOPHAGE) 1000 MG tablet Take 1 tablet (1,000 mg total) by mouth 2 (two) times daily with a meal.  180 tablet  3  . mirtazapine (REMERON) 45 MG tablet 1/2  tablet daily at bedtime  30 tablet  3     Orders Placed This Encounter  Procedures  . Magnesium  . Comprehensive metabolic panel  . TSH  . Vitamin D 25 hydroxy  . Microalbumin / creatinine urine ratio  . Prealbumin  . POCT urinalysis dipstick    Return in about 6 months (around 01/04/2013).

## 2012-07-08 LAB — PREALBUMIN: Prealbumin: 21.6 mg/dL (ref 17.0–34.0)

## 2012-07-09 ENCOUNTER — Encounter: Payer: Self-pay | Admitting: Internal Medicine

## 2012-07-09 DIAGNOSIS — K5900 Constipation, unspecified: Secondary | ICD-10-CM | POA: Insufficient documentation

## 2012-07-09 DIAGNOSIS — E559 Vitamin D deficiency, unspecified: Secondary | ICD-10-CM | POA: Insufficient documentation

## 2012-07-09 MED ORDER — ERGOCALCIFEROL 1.25 MG (50000 UT) PO CAPS: 50000.0000 [IU] | ORAL_CAPSULE | ORAL | Status: DC

## 2012-07-09 MED ORDER — MAGNESIUM OXIDE 400 MG PO TABS: 400.0000 mg | ORAL_TABLET | Freq: Two times a day (BID) | ORAL | Status: AC

## 2012-07-09 NOTE — Assessment & Plan Note (Signed)
Heart rate is well controlled today on current medications. No changes. She denies orthopnea and dyspnea with daily exertion

## 2012-07-09 NOTE — Assessment & Plan Note (Signed)
Secondary to poor by mouth intake. Supplements prescribed.

## 2012-07-09 NOTE — Assessment & Plan Note (Signed)
Recommended daily use of a fiber supplement such as MiraLAX. Also recommended to aim for 316 ounce glasses of water daily.

## 2012-07-09 NOTE — Assessment & Plan Note (Signed)
Well-controlled on current medications 

## 2012-07-09 NOTE — Assessment & Plan Note (Addendum)
Her last hemoglobin A1c was 10.6. We are no longer checking them given her age and inability to use insulin and therefore achieve good glycemic control. Continue glipizide, metformin, and samples of Januvia x2 months given to patient today.

## 2012-07-09 NOTE — Assessment & Plan Note (Signed)
Creatinine is stable. Electrolytes are normal.

## 2012-07-09 NOTE — Assessment & Plan Note (Signed)
Will prescribe Drisdol 50,000 units weekly for the winter months.

## 2012-07-20 ENCOUNTER — Telehealth: Payer: Self-pay | Admitting: *Deleted

## 2012-07-20 MED ORDER — GLIPIZIDE 10 MG PO TABS: 10.0000 mg | ORAL_TABLET | Freq: Two times a day (BID) | ORAL | Status: DC

## 2012-07-20 NOTE — Telephone Encounter (Signed)
Pt brother called states that for his sisters Glucotrol 10 mg it was suppose to written for three months, that there will be only 90 tablets each time when there is suppose to be 180 tablets, was wondering if he needed a new rx and if someone give him a call back about this at [phone]

## 2012-07-20 NOTE — Addendum Note (Signed)
Addended by: Sherlene Shams on: 07/20/2012 09:08 PM   Modules accepted: Orders

## 2012-07-20 NOTE — Telephone Encounter (Signed)
corrected glipizide rx sent to [facility]

## 2012-08-23 ENCOUNTER — Telehealth: Payer: Self-pay | Admitting: Internal Medicine

## 2012-08-23 NOTE — Telephone Encounter (Signed)
Patient Information:  Caller Name: [name]  Phone: [phone]  Patient: Alexandria Macias, Alexandria Macias  Gender: Female  DOB: [birth year]  Age: 79 Years  PCP: Duncan Dull (Adults only)  Office Follow Up:  Does the office need to follow up with this patient?: Yes  Instructions For The Office: Please see notes - caller wants appointment to see Dr. Darrick Huntsman, refused to go to ED.  Thank you.  RN Note:  Caller states he is not with the patient at time of call; states he  will follow up with her.  He would like to schedule appointment with Dr. Darrick Huntsman for 08/25/12.  Symptoms  Reason For Call & Symptoms: Bob/ sibling calling. Reports patient has  FSBG 398.  Onset sugar elevation estimated 08/16/12 when it was over 400.  She had  stopped Metformin due to not feeling well; resumed medication on 08/15/12.  She has cold symptoms and feels groggy.  Caller relates that patient also has very deep cough. Advised ED for evaluation due to age and multiple symptoms as well as being after 16:00.  Caller requested that patient be seen 08/25/12 instead.  Reinforced need to have her seen ASAP.  Reviewed Health History In EMR: Yes  Reviewed Medications In EMR: Yes  Reviewed Allergies In EMR: Yes  Reviewed Surgeries / Procedures: Yes  Date of Onset of Symptoms: 08/16/2012  Treatments Tried: Januvia, Glipizide, Metformin  Treatments Tried Worked: No  Guideline(s) Used:  Diabetes - High Blood Sugar  Disposition Per Guideline:   Go to ED Now (or to Office with PCP Approval)  Reason For Disposition Reached:   Patient sounds very sick or weak to the triager  Advice Given:  Treatment - Diabetes Medications  : Continue taking your diabetes pills.  Measure and Record Your Blood Glucose  Every day you should measure your blood glucose before breakfast and before going to bed.  Record the results and show them to your doctor at your next office visit.  Patient Refused Recommendation:  Patient Refused Appt, Patient Requests Appt  At Later Date  Requests to see Dr. Darrick Huntsman on 08/25/12 @ 0830 - he was offerd that time when he originally spoke with receptionist.  Refused to go to ED, states the wait is too long.

## 2012-08-23 NOTE — Telephone Encounter (Signed)
I do not have an appt available on Friday ,  If raquel does she can see her or he will have to taker her to urgent care.

## 2012-08-24 NOTE — Telephone Encounter (Signed)
Patient scheduled with Raquel for 08/25/12

## 2012-08-25 ENCOUNTER — Ambulatory Visit (INDEPENDENT_AMBULATORY_CARE_PROVIDER_SITE_OTHER): Payer: Medicare Other | Admitting: Adult Health

## 2012-08-25 ENCOUNTER — Encounter: Payer: Self-pay | Admitting: Adult Health

## 2012-08-25 ENCOUNTER — Ambulatory Visit (INDEPENDENT_AMBULATORY_CARE_PROVIDER_SITE_OTHER)
Admission: RE | Admit: 2012-08-25 | Discharge: 2012-08-25 | Disposition: A | Discharge status: Home / Self Care | Payer: Medicare Other | Source: Ambulatory Visit | Attending: Adult Health | Admitting: Adult Health

## 2012-08-25 VITALS — BP 141/79 | HR 103 | Temp 97.7°F | Resp 18 | Wt 148.0 lb

## 2012-08-25 DIAGNOSIS — R0602 Shortness of breath: Secondary | ICD-10-CM

## 2012-08-25 DIAGNOSIS — R05 Cough: Secondary | ICD-10-CM | POA: Insufficient documentation

## 2012-08-25 DIAGNOSIS — E119 Type 2 diabetes mellitus without complications: Secondary | ICD-10-CM

## 2012-08-25 DIAGNOSIS — E1165 Type 2 diabetes mellitus with hyperglycemia: Secondary | ICD-10-CM

## 2012-08-25 LAB — BASIC METABOLIC PANEL
BUN: 21 mg/dL (ref 6–23)
Calcium: 9.2 mg/dL (ref 8.4–10.5)
Creatinine, Ser: 1.2 mg/dL (ref 0.4–1.2)
GFR: 45.91 mL/min — ABNORMAL LOW (ref >60.00)
Potassium: 4.4 mEq/L (ref 3.5–5.1)

## 2012-08-25 LAB — BRAIN NATRIURETIC PEPTIDE: Pro B Natriuretic peptide (BNP): 344 pg/mL — ABNORMAL HIGH (ref 0.0–100.0)

## 2012-08-25 MED ORDER — LEVOFLOXACIN 250 MG PO TABS: ORAL_TABLET | ORAL | Status: DC

## 2012-08-25 NOTE — Assessment & Plan Note (Signed)
Shortness of breath increased with episodes of coughing. Differentials include CHF, pneumonia, GERD, bronchitis. Ordered chest x-ray, BNP, bmet. Will treat empirically with Levaquin. Patient with creatinine clearance of 29.06 and advanced age. Renally dose Levaquin as follows: 500 mg x1 then 250 mg every 24 hours for 6 days.

## 2012-08-25 NOTE — Progress Notes (Signed)
Subjective:    Patient ID: Alexandria Macias, female    DOB: 1918/03/18, 79 y.o.   MRN: [id number]  HPI  Patient is a pleasant 79 y/o female with history of hypertension, diabetes mellitus type 2, sick sinus syndrome, chronic kidney disease who presents to clinic with c/o elevated BG. Her AM readings are anywhere between 246 - 435. Her brother is present during visit and he reports that patient stopped taking her metformin because she felt it was too much. She was taking 1000 mg at bedtime. Her brother cut the tablets in half but patient still would not take medication. He correlates the spikes in her blood glucose with discontinuing the medication. She was without the metformin for ~ 2 weeks. She has since restarted the medication and has been taking it for approximately 1.5 weeks.  Also, patient reports a cough "that takes my breath away". Denies fever, chills. She does report shortness of breath with the cough and hears a rattling in her chest.  Her brother also reports that patient fell approximately 2 weeks ago. He is not certain what the events were prior to her fall. He does not think it was related to her BG. He reports that the patient called a sister and reported that she could not move her leg. When the sister arrived, patient was on the floor. Patient reports that she was using her walker when her left leg "gave out". No reported injury from fall. She reports being able to stand and continues to use walker.    Current Outpatient Prescriptions on File Prior to Visit  Medication Sig Dispense Refill  . digoxin (LANOXIN) 0.125 MG tablet TAKE ONE TABLET BY MOUTH EVERY OTHER DAY  30 tablet  5  . diltiazem (CARDIZEM CD) 180 MG 24 hr capsule TAKE TWO CAPSULES BY MOUTH EVERY DAY FOR HEART RATE  180 capsule  3  . ergocalciferol (DRISDOL) 50000 UNITS capsule Take 1 capsule (50,000 Units total) by mouth once a week. 90 days supply: take with food  12 capsule  0  . glipiZIDE (GLUCOTROL) 10 MG  tablet Take 1 tablet (10 mg total) by mouth 2 (two) times daily before a meal.  180 tablet  3  . magnesium oxide (MAG-OX 400) 400 MG tablet Take 1 tablet (400 mg total) by mouth 2 (two) times daily.  180 tablet  3  . metFORMIN (GLUCOPHAGE) 1000 MG tablet Take 1 tablet (1,000 mg total) by mouth 2 (two) times daily with a meal.  180 tablet  3  . mirtazapine (REMERON) 45 MG tablet 1/2  tablet daily at bedtime  30 tablet  3  . sitaGLIPtin (JANUVIA) 100 MG tablet Take 1 tablet (100 mg total) by mouth daily.  60 tablet  0   No current facility-administered medications on file prior to visit.      Review of Systems  Constitutional: Negative for fever and chills.  Respiratory: Positive for cough and shortness of breath. Negative for chest tightness.   Cardiovascular: Positive for leg swelling. Negative for chest pain and palpitations.  Gastrointestinal: Negative for nausea, vomiting, diarrhea and constipation.  Endocrine:       Blood sugars are elevated.  Neurological: Negative for dizziness, syncope, light-headedness and headaches.    BP 141/79  Pulse 103  Temp(Src) 97.7 F (36.5 C)  Resp 18  Wt 148 lb (67.132 kg)  SpO2 96%     Objective:   Physical Exam  Constitutional: She is oriented to person, place, and time. No distress.  HENT:  HOH.  Cardiovascular: Regular rhythm and normal heart sounds.   Tachycardia  Pulmonary/Chest: She has wheezes. She has rales.  Rhonchi  Musculoskeletal: She exhibits edema.  1+ edema - LLE > RLE.   Neurological: She is alert and oriented to person, place, and time.  Skin: Skin is warm and dry.  Psychiatric: She has a normal mood and affect. Her behavior is normal.       Assessment & Plan:

## 2012-08-25 NOTE — Assessment & Plan Note (Signed)
Spikes in blood glucoses seem to correlate with patient not taking her metformin. She has resumed this medication. Blood glucose in the office today was 238. As long as blood glucose does not go above 300, recommended no medication changes given patient's advanced age. Would not want to risk hypoglycemic event.

## 2012-08-26 ENCOUNTER — Other Ambulatory Visit: Payer: Self-pay | Admitting: Internal Medicine

## 2012-10-05 ENCOUNTER — Other Ambulatory Visit: Payer: Self-pay | Admitting: Internal Medicine

## 2012-10-05 NOTE — Telephone Encounter (Signed)
Med filled.  

## 2012-10-24 ENCOUNTER — Telehealth: Payer: Self-pay

## 2012-10-24 NOTE — Telephone Encounter (Signed)
Patient brother called informing us that the patient is out of her medication Ergocalciferol (Drisdol), he states that there are no refill on the medication. He wants to know does the patient need more refills or did you just want her to just take the medication for just that one time.

## 2012-10-24 NOTE — Telephone Encounter (Signed)
She can take OTC Vit d3 1000 units daily instead,  But if it costs her less to take the weekly prescribed ergocalciferol,   I will refill it.

## 2012-10-24 NOTE — Telephone Encounter (Signed)
Called patient brother [name] to inform him to have the patient take OTC Vit D3 1000 units daily. But if it cost her less we can refill her ergocalciferol, said he would give a call back and let us know.

## 2012-11-27 ENCOUNTER — Telehealth: Payer: Self-pay | Admitting: Internal Medicine

## 2012-11-27 NOTE — Telephone Encounter (Signed)
She needs to be seen ASAP by me or any available provider but can increase the lasix to 40 mg twice daily .

## 2012-11-27 NOTE — Telephone Encounter (Signed)
Patient notified and appointment scheduled for 1045 with NP

## 2012-11-27 NOTE — Telephone Encounter (Signed)
Patient Information:  Caller Name: Alexandria Macias  Phone: [phone]  Patient: Alexandria Macias, Alexandria Macias  Gender: Female  DOB: 1918-03-07  Age: 79 Years  PCP: Duncan Dull (Adults only)  Office Follow Up:  Does the office need to follow up with this patient?: Yes  Instructions For The Office: Brother/Alexandria Macias requesting increased dosage of Lasix. Is willing to schedule appt for Alexandria Macias if med cannot be increased.   Symptoms  Reason For Call & Symptoms: Brother /Alexandria Macias states Alexandria Macias/Sister has had swelling to feet and ankles  for 2.5 weeks- onset 5 /15/14. States she has swelling to top of feet, and "swelling is not a smooth swelling, swelling looks lumpy." States her "feet look deformed."Taking Lasix 20 mg - 2 tabs qday with no improvement. No difficulty breathing. Per leg swelling and edema protocol has see provider today in office  due to new worsening  moderate swelling of both ankle. Brother Alexandria Macias  asking if Lasix dosage can be increased. States she is wearing "support hose."  Is willing to come in for appointment if medication cannot be increased.  Reviewed Health History In EMR: Yes  Reviewed Medications In EMR: Yes  Reviewed Allergies In EMR: Yes  Reviewed Surgeries / Procedures: Yes  Date of Onset of Symptoms: 11/09/2012  Treatments Tried: Lasix 20 mg tabs - 2 tabs every day x 2.5 weeks.  Treatments Tried Worked: No  Guideline(s) Used:  Leg Swelling and Edema  Disposition Per Guideline:   See Today in Office  Reason For Disposition Reached:   Moderate swelling of both ankles (e.g., swelling extends up to the knees) AND new onset or worsening  Advice Given:  Call Back If:  You become worse.  Patient Refused Recommendation:  Patient Requests Prescription  Asking if LAsix  20 mg  - take 2 tabs qday prn can be increased?

## 2012-11-28 ENCOUNTER — Telehealth: Payer: Self-pay | Admitting: Internal Medicine

## 2012-11-28 ENCOUNTER — Ambulatory Visit (INDEPENDENT_AMBULATORY_CARE_PROVIDER_SITE_OTHER): Payer: Medicare Other | Admitting: Adult Health

## 2012-11-28 ENCOUNTER — Encounter: Payer: Self-pay | Admitting: Adult Health

## 2012-11-28 VITALS — BP 140/60 | HR 104 | Temp 97.6°F | Resp 12 | Wt 138.0 lb

## 2012-11-28 DIAGNOSIS — Z79899 Other long term (current) drug therapy: Secondary | ICD-10-CM

## 2012-11-28 DIAGNOSIS — R600 Localized edema: Secondary | ICD-10-CM

## 2012-11-28 DIAGNOSIS — I10 Essential (primary) hypertension: Secondary | ICD-10-CM

## 2012-11-28 DIAGNOSIS — R5383 Other fatigue: Secondary | ICD-10-CM

## 2012-11-28 DIAGNOSIS — R609 Edema, unspecified: Secondary | ICD-10-CM

## 2012-11-28 DIAGNOSIS — R5381 Other malaise: Secondary | ICD-10-CM

## 2012-11-28 LAB — BASIC METABOLIC PANEL
Calcium: 10 mg/dL (ref 8.4–10.5)
GFR: 37.93 mL/min — ABNORMAL LOW (ref >60.00)
Potassium: 4.2 mEq/L (ref 3.5–5.1)
Sodium: 135 mEq/L (ref 135–145)

## 2012-11-28 MED ORDER — METOPROLOL SUCCINATE ER 25 MG PO TB24: 25.0000 mg | ORAL_TABLET | Freq: Every day | ORAL | Status: DC

## 2012-11-28 NOTE — Progress Notes (Signed)
  Subjective:    Patient ID: Alexandria Macias, female    DOB: Sep 04, 1917, 79 y.o.   MRN: [id number]  HPI  Patient is a pleasant 79 year old female who presents to clinic with bilateral lower extremity edema. She normally takes Lasix 40 mg daily. She was instructed to take an extra 40 mg of Lasix yesterday secondary to reports of lower extremity edema. She currently denies shortness of breath at rest; however, she reports mild shortness of breath with exertion. She is able to have a conversation without any shortness of breath.   Current Outpatient Prescriptions on File Prior to Visit  Medication Sig Dispense Refill  . digoxin (LANOXIN) 0.125 MG tablet TAKE ONE TABLET BY MOUTH EVERY OTHER DAY  30 tablet  5  . diltiazem (CARDIZEM CD) 180 MG 24 hr capsule TAKE TWO CAPSULES BY MOUTH EVERY DAY FOR HEART RATE  180 capsule  3  . ergocalciferol (DRISDOL) 50000 UNITS capsule Take 1 capsule (50,000 Units total) by mouth once a week. 90 days supply: take with food  12 capsule  0  . furosemide (LASIX) 20 MG tablet TAKE TWO TABLETS BY MOUTH EVERY DAY AS NEEDED FOR FLUID RETENTION  60 tablet  0  . glipiZIDE (GLUCOTROL) 10 MG tablet Take 1 tablet (10 mg total) by mouth 2 (two) times daily before a meal.  180 tablet  3  . JANUVIA 100 MG tablet TAKE ONE TABLET BY MOUTH EVERY DAY  30 tablet  3  . magnesium oxide (MAG-OX 400) 400 MG tablet Take 1 tablet (400 mg total) by mouth 2 (two) times daily.  180 tablet  3  . metFORMIN (GLUCOPHAGE) 1000 MG tablet Take 1 tablet (1,000 mg total) by mouth 2 (two) times daily with a meal.  180 tablet  3  . mirtazapine (REMERON) 45 MG tablet 1/2  tablet daily at bedtime  30 tablet  3  . sitaGLIPtin (JANUVIA) 100 MG tablet Take 1 tablet (100 mg total) by mouth daily.  60 tablet  0   No current facility-administered medications on file prior to visit.    Review of Systems  Respiratory: Negative for cough, chest tightness and wheezing.        Shortness of breath with exertion   Cardiovascular: Positive for leg swelling. Negative for chest pain.  Genitourinary: Negative for difficulty urinating.   BP 140/60  Pulse 104  Temp(Src) 97.6 F (36.4 C) (Oral)  Resp 12  Wt 138 lb (62.596 kg)  BMI 24.06 kg/m2  SpO2 99%    Objective:   Physical Exam  Constitutional: She is oriented to person, place, and time.  Elderly female in no apparent distress.  Cardiovascular: An irregular rhythm present. Tachycardia present.   Pulmonary/Chest: Effort normal and breath sounds normal. No respiratory distress. She has no wheezes. She has no rales.  Musculoskeletal: She exhibits edema.  2+ pitting edema bilateral lower extremities.  Neurological: She is alert and oriented to person, place, and time.  Skin: No rash noted.  Psychiatric: She has a normal mood and affect. Her behavior is normal. Judgment and thought content normal.       Assessment & Plan:

## 2012-11-28 NOTE — Patient Instructions (Addendum)
  Weight down from previous visit. Last visit 148 lbs and today is 138 lbs.  Your heart rate is faster than what we would like it to be. I am starting you on Metoprolol 25 mg daily.   Continue the lasix 40 mg twice a day for 3 more days then return to your normal dose of once daily.  Please elevate your legs to help the swelling. If you are able to, please apply compression stockings. You can wear some that are knee high.  You will need to return to see Dr. Darrick Huntsman for follow up in 1 week.

## 2012-11-28 NOTE — Assessment & Plan Note (Addendum)
Weight down 10 pounds from previous visit in February. Continue Lasix 40 mg twice a day x3 days for lower extremity edema. Heart rate fast and irregular. Start metoprolol 25 mg daily. Check metabolic panel today. Instructed patient to elevate lower extremities as much as possible and to use compression stockings to help with edema. RTC in one week to followup with PCP. Note, greater than 25 minutes were spent in face-to-face communication with patient in the assessment, planning and implementation of care pertaining to this problem.

## 2012-11-28 NOTE — Telephone Encounter (Signed)
[name] wanted you to call him.  He had to leave to take his other sister hospital   He wants to know what he need to do about meds

## 2012-11-28 NOTE — Telephone Encounter (Signed)
Spoke with pt's brother, advising of directions for medications.

## 2012-11-29 ENCOUNTER — Other Ambulatory Visit: Payer: Self-pay | Admitting: Adult Health

## 2012-11-29 DIAGNOSIS — R899 Unspecified abnormal finding in specimens from other organs, systems and tissues: Secondary | ICD-10-CM

## 2012-12-01 ENCOUNTER — Other Ambulatory Visit: Payer: Medicare Other

## 2012-12-08 ENCOUNTER — Ambulatory Visit (INDEPENDENT_AMBULATORY_CARE_PROVIDER_SITE_OTHER): Payer: Medicare Other | Admitting: Internal Medicine

## 2012-12-08 ENCOUNTER — Encounter: Payer: Self-pay | Admitting: Internal Medicine

## 2012-12-08 ENCOUNTER — Telehealth: Payer: Self-pay | Admitting: *Deleted

## 2012-12-08 VITALS — BP 138/62 | HR 97 | Temp 98.1°F | Resp 14 | Wt 130.5 lb

## 2012-12-08 DIAGNOSIS — I129 Hypertensive chronic kidney disease with stage 1 through stage 4 chronic kidney disease, or unspecified chronic kidney disease: Secondary | ICD-10-CM

## 2012-12-08 DIAGNOSIS — E612 Magnesium deficiency: Secondary | ICD-10-CM | POA: Insufficient documentation

## 2012-12-08 DIAGNOSIS — E1165 Type 2 diabetes mellitus with hyperglycemia: Secondary | ICD-10-CM

## 2012-12-08 DIAGNOSIS — R300 Dysuria: Secondary | ICD-10-CM

## 2012-12-08 DIAGNOSIS — I10 Essential (primary) hypertension: Secondary | ICD-10-CM

## 2012-12-08 DIAGNOSIS — E1122 Type 2 diabetes mellitus with diabetic chronic kidney disease: Secondary | ICD-10-CM

## 2012-12-08 DIAGNOSIS — R609 Edema, unspecified: Secondary | ICD-10-CM

## 2012-12-08 DIAGNOSIS — E559 Vitamin D deficiency, unspecified: Secondary | ICD-10-CM

## 2012-12-08 DIAGNOSIS — N189 Chronic kidney disease, unspecified: Secondary | ICD-10-CM

## 2012-12-08 DIAGNOSIS — R5383 Other fatigue: Secondary | ICD-10-CM

## 2012-12-08 DIAGNOSIS — R600 Localized edema: Secondary | ICD-10-CM

## 2012-12-08 LAB — COMPREHENSIVE METABOLIC PANEL
ALT: 12 U/L (ref 0–35)
AST: 18 U/L (ref 0–37)
Albumin: 4.2 g/dL (ref 3.5–5.2)
Alkaline Phosphatase: 47 U/L (ref 39–117)
BUN: 19 mg/dL (ref 6–23)
Calcium: 10 mg/dL (ref 8.4–10.5)
Chloride: 99 mEq/L (ref 96–112)
Potassium: 4 mEq/L (ref 3.5–5.1)
Sodium: 135 mEq/L (ref 135–145)

## 2012-12-08 LAB — POCT URINALYSIS DIPSTICK
Ketones, UA: 15
Leukocytes, UA: NEGATIVE
Spec Grav, UA: 1.02
pH, UA: 5.5

## 2012-12-08 LAB — HEMOGLOBIN A1C: Hgb A1c MFr Bld: 9.5 % — ABNORMAL HIGH (ref 4.6–6.5)

## 2012-12-08 LAB — MAGNESIUM: Magnesium: 1.6 mg/dL (ref 1.5–2.5)

## 2012-12-08 NOTE — Assessment & Plan Note (Signed)
Today's fatigue may be due to overdiuresis,  As she is down  Lbs compared to last week.  I have reduced her dursoemdie from 80 mg daily to 40 mg daily pending evaluation of renal funciton

## 2012-12-08 NOTE — Assessment & Plan Note (Addendum)
Well controlled on current regimen. Renal function is  Improved after a week of aggressive diuresis .

## 2012-12-08 NOTE — Assessment & Plan Note (Signed)
Checking vit d level today to determine need for continued mega supplementation

## 2012-12-08 NOTE — Assessment & Plan Note (Addendum)
Resolved with increased use of diuretics for the past week .  Will reduce dosing to once daily and increase prn wt gain of 2 lbs overnight.

## 2012-12-08 NOTE — Progress Notes (Signed)
Patient ID: Alexandria Macias, female   DOB: 04-17-1918, 79 y.o.   MRN: [id number]  Patient Active Problem List   Diagnosis Date Noted  . Magnesium deficiency 12/08/2012  . Edema extremities 11/28/2012  . Cough 08/25/2012  . Constipation 07/09/2012  . Vitamin D deficiency 07/09/2012  . Hypomagnesemia 07/09/2012  . Fatigue 11/28/2011  . Chronic kidney disease in type 2 diabetes mellitus 11/28/2011  . Hypertension   . Diabetes mellitus type 2, uncontrolled   . History of sick sinus syndrome     Subjective:  CC:   Chief Complaint  Patient presents with  . Follow-up    HPI:   Alexandria Macias a 79 y.o. female who presents Follow up on recent ER visit for CHF.  Patient was referred to ER by [name] last week after she was found to have significant volume overload and tachycardia. She was evaluated and sent home  She has been taking an increased daily regimen of furosemide since then , 40 mg bid, and has lost 8 lbs since last visit.  Her lower extremity edema has resolved.  She  denies chest pain and dyspnea but endorses fatigue, and poor appetite, which are chronic.   2) Six-month followup on diabetes, atrial fibrillation and other issues.. She is accompanied by her brother [name]. 1) anorexia. She continues to have decreased appetite and weight loss byut has adequate protein stores by prior prealbumin evaluation. Her symptoms been present since her gallbladder was taken out several years ago.  Her sugars have been running in the 200-270 range. She is dependent on samples of Januvia when available. She has refused to self administer insulin. He's had no recent hypoglycemic events. She's had no recent history of falls.    Past Medical History  Diagnosis Date  . 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 Sick sinus syndrome Feb 2009    pacer implant  . Hx of cardiac catheterization     right and left heart  . Rosacea   . Hypertension   . Diabetes mellitus   . History of  sick sinus syndrome     Past Surgical History  Procedure Laterality Date  . Abdominal hysterectomy    . Pacemaker insertion  2009  . Cholecystectomy  Oct 2010    chronic cholecystitis       The following portions of the patient's history were reviewed and updated as appropriate: Allergies, current medications, and problem list.    Review of Systems:   Patient denies headache, fevers,   skin rash, eye pain, sinus congestion and sinus pain, sore throat, dysphagia,  hemoptysis , cough, dyspnea, wheezing, chest pain, palpitations, orthopnea, edema, abdominal pain, nausea, melena, diarrhea,flank pain, dysuria, hematuria, urinary  Frequency, nocturia, numbness, tingling, seizures,  Focal weakness, Loss of consciousness,  Tremor, insomnia, depression, anxiety, and suicidal ideation.       History   Social History  . Marital Status: Widowed    Spouse Name: N/A    Number of Children: N/A  . Years of Education: N/A   Occupational History  . Not on file.   Social History Main Topics  . Smoking status: Never Smoker   . Smokeless tobacco: Never Used  . Alcohol Use: No  . Drug Use: No  . Sexually Active: Not on file   Other Topics Concern  . Not on file   Social History Narrative  . No narrative on file    Objective:  BP 138/62  Pulse 97  Temp(Src) 98.1 F (36.7 C) (Oral)  Resp 14  Wt 130 lb 8 oz (59.194 kg)  BMI 22.75 kg/m2  SpO2 98%  General appearance: alert, cooperative and appears stated age Ears: normal TM's and external ear canals both ears Throat: lips, mucosa, and tongue normal; teeth and gums normal Neck: no adenopathy, no carotid bruit, supple, symmetrical, trachea midline and thyroid not enlarged, symmetric, no tenderness/mass/nodules Back: symmetric, no curvature. ROM normal. No CVA tenderness. Lungs: clear to auscultation bilaterally Heart: regular rate and rhythm, S1, S2 normal, no murmur, click, rub or gallop Abdomen: soft, non-tender; bowel  sounds normal; no masses,  no organomegaly Pulses: 2+ and symmetric Skin: Skin color, texture, turgor normal. No rashes or lesions Lymph nodes: Cervical, supraclavicular, and axillary nodes normal. Foot exam:  Nails are well trimmed,  No callouses,  Sensation intact to microfilament  Assessment and Plan:  Fatigue Today's fatigue may be due to overdiuresis,  As she is down  8 Lbs compared to last week.  I have reduced her furosemmide from 80 mg daily to 40 mg daily . Vitamin D deficiency Checking vit d level today to determine need for continued mega supplementation  Magnesium deficiency secondary to weight loss .  Level was 1.6 today so supplements to be continued.   Edema extremities Resolved with increased use of diuretics for the past week .  Will reduce dosing to once daily and increase prn wt gain of 2 lbs overnight.   Hypertension Well controlled on current regimen. Renal function is  Improved after a week of aggressive diuresis .  Diabetes mellitus type 2, uncontrolled Secondary to economic and social barriers.  She refuses to administer insulin and has no family member available to do so consistently.  Given her extreme age we are treating with the available noninjectible medications includig sulfonlyureas and gliptans. Metformin has been discontinued due to her GFR.   Chronic kidney disease in type 2 diabetes mellitus Metformin stopped.  GFR improved with diuresis and control of heart rate and diastolic dysfunction.  A total of 40 minutes was spent with patient more than half of which was spent in counseling, reviewing records from other providers and coordination of care.    Updated Medication List Outpatient Encounter Prescriptions as of 12/08/2012  Medication Sig Dispense Refill  . digoxin (LANOXIN) 0.125 MG tablet TAKE ONE TABLET BY MOUTH EVERY OTHER DAY  30 tablet  5  . diltiazem (CARDIZEM CD) 180 MG 24 hr capsule TAKE TWO CAPSULES BY MOUTH EVERY DAY FOR HEART RATE   180 capsule  3  . ergocalciferol (DRISDOL) 50000 UNITS capsule Take 1 capsule (50,000 Units total) by mouth once a week. 90 days supply: take with food  12 capsule  0  . furosemide (LASIX) 20 MG tablet TAKE TWO TABLETS BY MOUTH EVERY DAY AS NEEDED FOR FLUID RETENTION  60 tablet  0  . glipiZIDE (GLUCOTROL) 10 MG tablet Take 1 tablet (10 mg total) by mouth 2 (two) times daily before a meal.  180 tablet  3  . JANUVIA 100 MG tablet TAKE ONE TABLET BY MOUTH EVERY DAY  30 tablet  3  . magnesium oxide (MAG-OX 400) 400 MG tablet Take 1 tablet (400 mg total) by mouth 2 (two) times daily.  180 tablet  3  . mirtazapine (REMERON) 45 MG tablet 1/2  tablet daily at bedtime  30 tablet  3  . sitaGLIPtin (JANUVIA) 100 MG tablet Take 1 tablet (100 mg total) by mouth daily.  60 tablet  0  . [DISCONTINUED] metFORMIN (GLUCOPHAGE) 1000 MG tablet Take 1 tablet (1,000 mg total) by mouth 2 (two) times daily with a meal.  180 tablet  3  . metoprolol succinate (TOPROL-XL) 25 MG 24 hr tablet Take 1 tablet (25 mg total) by mouth daily.  30 tablet  3   No facility-administered encounter medications on file as of 12/08/2012.     Orders Placed This Encounter  Procedures  . Hemoglobin A1c  . Comprehensive metabolic panel  . Vitamin D 25 hydroxy  . Magnesium  . POCT Urinalysis Dipstick    No Follow-up on file.

## 2012-12-08 NOTE — Patient Instructions (Signed)
We will check your kidney function, electrolytes,  and your urine for for infection   Reduce your fluid pill to 2 in the morning only   Please weigh yourself every day  In the morning after your empty your bladder.    Have [name] make a clipboard to keep next to the scale so you can record your weights .  If your weight increases by 2 lbs overnight,  Take an extra fluid pill that morning.

## 2012-12-08 NOTE — Assessment & Plan Note (Signed)
secondary to weight loss .  Will recheck level today

## 2012-12-08 NOTE — Telephone Encounter (Signed)
Would you like a urine culture?  

## 2012-12-08 NOTE — Telephone Encounter (Signed)
No thanks urine is negative for signs of infection

## 2012-12-10 NOTE — Assessment & Plan Note (Addendum)
Secondary to economic and social barriers.  She refuses to administer insulin and has no family member available to do so consistently.  Given her extreme age we are treating with the available noninjectible medications includig sulfonlyureas and gliptans. Metformin has been discontinued due to her GFR.

## 2012-12-10 NOTE — Assessment & Plan Note (Signed)
Metformin stopped.  GFR improved with diuresis and control of heart rate and diastolic dysfunction.

## 2012-12-10 NOTE — Assessment & Plan Note (Addendum)
continue oral magnesium supplementation for low Mg of 1.6 on recheck.

## 2012-12-30 ENCOUNTER — Other Ambulatory Visit: Payer: Self-pay | Admitting: Internal Medicine

## 2013-01-01 ENCOUNTER — Other Ambulatory Visit: Payer: Self-pay | Admitting: *Deleted

## 2013-01-01 MED ORDER — DIGOXIN 125 MCG PO TABS: ORAL_TABLET | ORAL | Status: DC

## 2013-02-17 ENCOUNTER — Other Ambulatory Visit: Payer: Self-pay | Admitting: Internal Medicine

## 2013-04-03 LAB — HM DIABETES EYE EXAM

## 2013-05-23 ENCOUNTER — Other Ambulatory Visit: Payer: Self-pay | Admitting: Internal Medicine

## 2013-05-25 MED ORDER — DILTIAZEM HCL ER COATED BEADS 180 MG PO CP24: ORAL_CAPSULE | ORAL | Status: DC

## 2013-06-11 ENCOUNTER — Ambulatory Visit: Payer: Self-pay | Admitting: Podiatry

## 2013-06-14 LAB — HM DIABETES EYE EXAM

## 2013-06-27 ENCOUNTER — Ambulatory Visit: Payer: Medicare Other | Admitting: Podiatry

## 2013-06-27 ENCOUNTER — Encounter: Payer: Self-pay | Admitting: Podiatry

## 2013-06-27 VITALS — BP 123/66 | HR 105 | Resp 14

## 2013-06-27 DIAGNOSIS — B351 Tinea unguium: Secondary | ICD-10-CM

## 2013-06-27 DIAGNOSIS — M79609 Pain in unspecified limb: Secondary | ICD-10-CM

## 2013-06-27 NOTE — Progress Notes (Signed)
   Subjective:    Patient ID: Alexandria Macias, female    DOB: 09/27/1917, 79 y.o.   MRN: [id number]  HPI Comments: " just the toenails "     Review of Systems     Objective:   Physical Exam: Vital signs are stable she is alert and oriented x3 pulses are palpable bilateral. Nails are thick yellow dystrophic with mycotic painful palpation.        Assessment & Plan:  Assessment: Pain in limb secondary to onychomycosis 1 through 5 bilateral.  Plan: Debridement of nails 1 through 5 bilateral is cover service secondary to pain

## 2013-07-14 ENCOUNTER — Other Ambulatory Visit: Payer: Self-pay | Admitting: Internal Medicine

## 2013-08-01 ENCOUNTER — Telehealth: Payer: Self-pay | Admitting: *Deleted

## 2013-08-01 NOTE — Telephone Encounter (Signed)
Refill Request  Metformin 1000 mg tab  #180  Take one tablet by mouth twice daily with a meal

## 2013-08-01 NOTE — Telephone Encounter (Signed)
Metformin was D/C'd per last OV note.

## 2013-08-03 ENCOUNTER — Telehealth: Payer: Self-pay | Admitting: *Deleted

## 2013-08-03 NOTE — Telephone Encounter (Signed)
Patient caregiver calling for refill on Metformin , according to chart though looks as Metformin was discontinued on 12/08/12. Please advise

## 2013-08-03 NOTE — Telephone Encounter (Signed)
Lorain Childes  Brother   [phone] He wanted to get for more than one refill  He stated she has been taking 2-3 years Pt has 2 more pills  [facility] [address]

## 2013-08-03 NOTE — Telephone Encounter (Signed)
Metformin was indeed stopped at last visit due to her level of kidney function being too low.  If she has been taking it all along, that was not the intention .  I would rather her take Januvia for a few weeks and have her kidney function rechecked soon before  I resume the metformin .  They will need samples of Januvia

## 2013-08-03 NOTE — Telephone Encounter (Signed)
FYI patient has Januvia and has been taking it with Metformin and Glipizide  Informed patient to stop Metformin as instructed and patient voiced understanding and was pleased not to have to take as many meds.

## 2013-08-18 ENCOUNTER — Other Ambulatory Visit: Payer: Self-pay | Admitting: Internal Medicine

## 2013-08-21 ENCOUNTER — Telehealth: Payer: Self-pay | Admitting: Internal Medicine

## 2013-08-21 MED ORDER — METFORMIN HCL 1000 MG PO TABS: 1000.0000 mg | ORAL_TABLET | Freq: Two times a day (BID) | ORAL | Status: AC

## 2013-08-21 NOTE — Telephone Encounter (Signed)
States he went to visit pt last night and her sugar was 491.  States she was taken off of metformin by Dr. Derrel Nip.  States he had some metformin 1000 mg tablets and gave her one last night.  This morning her sugar was down to 361.  She took another 1000 mg metformin this morning.  States he has #2 1000 mg metformin tabs left to give her one tonight and one tomorrow morning.  Asking if Dr. Derrel Nip could refill the metformin for her so that she can continue it.  Would like a call [phone].  States if we call the pt, she will tell us to call him.

## 2013-08-21 NOTE — Telephone Encounter (Signed)
Patient has been taking the Metformin up until 08/03/13 when she was advised to stop because it was D/C due to kidney function , but since patient has D/C Cbg's have increased , The CBG last night was 491 but was 1.5 hours post eating a box of Rasin's ( advised caregiver rasin's high in sugar ) but caregiver reports CBG's are still high please advise.

## 2013-08-21 NOTE — Telephone Encounter (Signed)
Patient caregiver notified and will pick up I also schedule a follow up appointment because patient does not have a DM follow up.FYI

## 2013-08-21 NOTE — Telephone Encounter (Signed)
metform is not the best choice given her kidney function, we have no other choices because neither she nor her sister is willing to use daily insulin.  If they still are unwilling to use insulin,  We can resume the metformin and I will call in an rx for 1000 mg twice daily.

## 2013-08-30 ENCOUNTER — Ambulatory Visit (INDEPENDENT_AMBULATORY_CARE_PROVIDER_SITE_OTHER): Payer: Medicare Other | Admitting: Internal Medicine

## 2013-08-30 ENCOUNTER — Encounter: Payer: Self-pay | Admitting: Internal Medicine

## 2013-08-30 VITALS — BP 138/76 | HR 102 | Temp 98.3°F | Resp 14 | Wt 141.8 lb

## 2013-08-30 DIAGNOSIS — R5383 Other fatigue: Secondary | ICD-10-CM

## 2013-08-30 DIAGNOSIS — IMO0002 Reserved for concepts with insufficient information to code with codable children: Secondary | ICD-10-CM

## 2013-08-30 DIAGNOSIS — N189 Chronic kidney disease, unspecified: Secondary | ICD-10-CM

## 2013-08-30 DIAGNOSIS — R531 Weakness: Secondary | ICD-10-CM

## 2013-08-30 DIAGNOSIS — R609 Edema, unspecified: Secondary | ICD-10-CM

## 2013-08-30 DIAGNOSIS — R600 Localized edema: Secondary | ICD-10-CM

## 2013-08-30 DIAGNOSIS — R0601 Orthopnea: Secondary | ICD-10-CM

## 2013-08-30 DIAGNOSIS — R296 Repeated falls: Secondary | ICD-10-CM

## 2013-08-30 DIAGNOSIS — R5381 Other malaise: Secondary | ICD-10-CM

## 2013-08-30 DIAGNOSIS — R0602 Shortness of breath: Secondary | ICD-10-CM

## 2013-08-30 DIAGNOSIS — I10 Essential (primary) hypertension: Secondary | ICD-10-CM

## 2013-08-30 DIAGNOSIS — I509 Heart failure, unspecified: Secondary | ICD-10-CM

## 2013-08-30 DIAGNOSIS — Z79899 Other long term (current) drug therapy: Secondary | ICD-10-CM

## 2013-08-30 DIAGNOSIS — IMO0001 Reserved for inherently not codable concepts without codable children: Secondary | ICD-10-CM

## 2013-08-30 DIAGNOSIS — E1122 Type 2 diabetes mellitus with diabetic chronic kidney disease: Secondary | ICD-10-CM

## 2013-08-30 DIAGNOSIS — R059 Cough, unspecified: Secondary | ICD-10-CM

## 2013-08-30 DIAGNOSIS — E1129 Type 2 diabetes mellitus with other diabetic kidney complication: Secondary | ICD-10-CM

## 2013-08-30 DIAGNOSIS — Z9181 History of falling: Secondary | ICD-10-CM

## 2013-08-30 DIAGNOSIS — E1165 Type 2 diabetes mellitus with hyperglycemia: Secondary | ICD-10-CM

## 2013-08-30 DIAGNOSIS — R05 Cough: Secondary | ICD-10-CM

## 2013-08-30 LAB — CBC WITH DIFFERENTIAL/PLATELET
Basophils Absolute: 0 10*3/uL (ref 0.0–0.1)
Basophils Relative: 0.5 % (ref 0.0–3.0)
Eosinophils Absolute: 0 10*3/uL (ref 0.0–0.7)
Eosinophils Relative: 0.2 % (ref 0.0–5.0)
HCT: 38.9 % (ref 36.0–46.0)
HEMOGLOBIN: 12.8 g/dL (ref 12.0–15.0)
LYMPHS PCT: 15.3 % (ref 12.0–46.0)
Lymphs Abs: 0.8 10*3/uL (ref 0.7–4.0)
MCHC: 32.8 g/dL (ref 30.0–36.0)
MCV: 94.8 fl (ref 78.0–100.0)
MONOS PCT: 13.6 % — AB (ref 3.0–12.0)
Monocytes Absolute: 0.7 10*3/uL (ref 0.1–1.0)
NEUTROS ABS: 3.7 10*3/uL (ref 1.4–7.7)
Neutrophils Relative %: 70.4 % (ref 43.0–77.0)
Platelets: 142 10*3/uL — ABNORMAL LOW (ref 150.0–400.0)
RBC: 4.1 Mil/uL (ref 3.87–5.11)
RDW: 15.1 % — ABNORMAL HIGH (ref 11.5–14.6)
WBC: 5.2 10*3/uL (ref 4.5–10.5)

## 2013-08-30 LAB — POCT URINALYSIS DIPSTICK
Bilirubin, UA: NEGATIVE
LEUKOCYTES UA: NEGATIVE
NITRITE UA: NEGATIVE
Protein, UA: 100
Spec Grav, UA: 1.025
UROBILINOGEN UA: 0.2
pH, UA: 5

## 2013-08-30 LAB — HEMOGLOBIN A1C: HEMOGLOBIN A1C: 11.9 % — AB (ref 4.6–6.5)

## 2013-08-30 LAB — COMPLETE METABOLIC PANEL WITH GFR
ALBUMIN: 4.1 g/dL (ref 3.5–5.2)
ALK PHOS: 55 U/L (ref 39–117)
ALT: 14 U/L (ref 0–35)
AST: 19 U/L (ref 0–37)
BUN: 16 mg/dL (ref 6–23)
CO2: 21 mEq/L (ref 19–32)
Calcium: 9.1 mg/dL (ref 8.4–10.5)
Chloride: 97 mEq/L (ref 96–112)
Creat: 1.13 mg/dL — ABNORMAL HIGH (ref 0.50–1.10)
GFR, Est African American: 48 mL/min — ABNORMAL LOW
GFR, Est Non African American: 42 mL/min — ABNORMAL LOW
Glucose, Bld: 271 mg/dL — ABNORMAL HIGH (ref 70–99)
POTASSIUM: 4.7 meq/L (ref 3.5–5.3)
Sodium: 131 mEq/L — ABNORMAL LOW (ref 135–145)
Total Bilirubin: 0.4 mg/dL (ref 0.2–1.2)
Total Protein: 6.5 g/dL (ref 6.0–8.3)

## 2013-08-30 LAB — HM DIABETES FOOT EXAM: HM Diabetic Foot Exam: NORMAL

## 2013-08-30 LAB — BRAIN NATRIURETIC PEPTIDE: PRO B NATRI PEPTIDE: 304 pg/mL — AB (ref 0.0–100.0)

## 2013-08-30 NOTE — Progress Notes (Signed)
Pre-visit discussion using our clinic review tool. No additional management support is needed unless otherwise documented below in the visit note.  

## 2013-08-30 NOTE — Patient Instructions (Signed)
Please resume furosemide 20 mg daily in the morning  Use the [name] samples twice daily instead of your metformin .  It is a combined Januvia/metformin tablet  We will find out if Medicare will pay for Home Health to come out and give you a daily insulin injection   You need to use a lift chair to help you get up and prevent falls

## 2013-08-30 NOTE — Progress Notes (Signed)
Patient ID: Alexandria Macias, female   DOB: 01-25-1918, 80 y.o.   MRN: [id number]   Patient Active Problem List   Diagnosis Date Noted  . Magnesium deficiency 12/08/2012  . Edema extremities 11/28/2012  . Cough 08/25/2012  . Constipation 07/09/2012  . Vitamin D deficiency 07/09/2012  . Hypomagnesemia 07/09/2012  . Fatigue 11/28/2011  . Chronic kidney disease in type 2 diabetes mellitus 11/28/2011  . Hypertension   . Diabetes mellitus type 2, uncontrolled   . History of sick sinus syndrome     Subjective:  CC:   Chief Complaint  Patient presents with  . Follow-up    increased CBG's    HPI:   Alexandria Macias is a 80 y.o. female who presents for  Follow up on chronic conditions including atrial fibrillation with sick sinus syndrome s/p pacer,     Hypertension and uncontrolled DM with CKD secondary to medication noncompliance (patient refuses to use insulin)  Sugars have jumped up to 500 since discontinuing  metformin two weeks ago so she has resumed it since they are unable to afford Januvai on a regular basis and unable to self administer daily insulin.  Sugars have improved and are now < 300.   The Januvia costs up to $300 at times,  Gets sleepy after eating,  Much more alert when sugars are around 200.     For the last several days has been short of breath,  Worse with supine position.  nO fevers ofr infectious symptoms.  Nonproductive cough,  at times productive.  Extremely weak, more so than usual.  Walks independently but very slowly.  Fell twice last week . Was Using the walker.  Having trouble rising from chair.  Refused to use the Lift chair her brother Mikki Santee had offered.       Past Medical History  Diagnosis Date  . 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 Sick sinus syndrome Feb 2009    pacer implant  . Hx of cardiac catheterization     right and left heart  . Rosacea   . Hypertension   . Diabetes mellitus   . History of sick sinus syndrome      Past Surgical History  Procedure Laterality Date  . Abdominal hysterectomy    . Pacemaker insertion  2009  . Cholecystectomy  Oct 2010    chronic cholecystitis       The following portions of the patient's history were reviewed and updated as appropriate: Allergies, current medications, and problem list.    Review of Systems:   Patient denies headache, fevers, malaise, unintentional weight loss, skin rash, eye pain, sinus congestion and sinus pain, sore throat, dysphagia,  hemoptysis , cough, dyspnea, wheezing, chest pain, palpitations, orthopnea, edema, abdominal pain, nausea, melena, diarrhea, constipation, flank pain, dysuria, hematuria, urinary  Frequency, nocturia, numbness, tingling, seizures,  Focal weakness, Loss of consciousness,  Tremor, insomnia, depression, anxiety, and suicidal ideation.     History   Social History  . Marital Status: Widowed    Spouse Name: N/A    Number of Children: N/A  . Years of Education: N/A   Occupational History  . Not on file.   Social History Main Topics  . Smoking status: Never Smoker   . Smokeless tobacco: Never Used  . Alcohol Use: No  . Drug Use: No  . Sexual Activity: No   Other Topics Concern  . Not on file   Social History Narrative  . No narrative on  file    Objective:  Filed Vitals:   08/30/13 1056  BP: 138/76  Pulse: 102  Temp: 98.3 F (36.8 C)  Resp: 14     General appearance: alert, cooperative and appears stated age Ears: normal TM's and external ear canals both ears Throat: lips, mucosa, and tongue normal; teeth and gums normal Neck: no adenopathy, no carotid bruit, supple, symmetrical, trachea midline and thyroid not enlarged, symmetric, no tenderness/mass/nodules Back: symmetric, no curvature. ROM normal. No CVA tenderness. Lungs: clear to auscultation bilaterally Heart: regular rate and rhythm, S1, S2 normal, no murmur, click, rub or gallop Abdomen: soft, non-tender; bowel sounds normal; no  masses,  no organomegaly Pulses: 2+ and symmetric Skin: Skin color, texture, turgor normal. No rashes or lesions Lymph nodes: Cervical, supraclavicular, and axillary nodes normal.  Assessment and Plan:  Diabetes mellitus type 2, uncontrolled Secondary to medication and dietary noncompliaace complicated by extreme age, economic and social stressors .  We have resumed metformin since is is the only affordable alternative.  Samples of [name] given for 3 months.  Referral to Home health to see i they can send a CMA out to give a dose of insulin daily. Lab Results  Component Value Date   HGBA1C 11.9* 08/30/2013   Lab Results  Component Value Date   MICROALBUR 19.2* 07/07/2012     Chronic kidney disease in type 2 diabetes mellitus Metformin resumed.  GFR improved with diuresis and control of heart rate and diastolic dysfunction.  Lab Results  Component Value Date   CREATININE 1.13* 08/30/2013   Lab Results  Component Value Date   NA 131* 08/30/2013   K 4.7 08/30/2013   CL 97 08/30/2013   CO2 21 08/30/2013       Cough Concerned about early chf,  But BNP is < 500.  Will stop the daily furosemide that was started today   Edema extremities .  Will reduce dosing to once daily and increase prn wt gain of 2 lbs overnight.     Frequent falls Advised patient to use the chair lift that her brother has provided and use walker., Patient is adamant about remaining in her home .   Updated Medication List Outpatient Encounter Prescriptions as of 08/30/2013  Medication Sig  . digoxin (LANOXIN) 0.125 MG tablet TAKE ONE TABLET BY MOUTH EVERY OTHER DAY  . diltiazem (CARDIZEM CD) 180 MG 24 hr capsule TAKE TWO CAPSULES BY MOUTH EVERY DAY FOR HEART RATE  . furosemide (LASIX) 20 MG tablet TAKE TWO TABLETS BY MOUTH ONCE DAILY AS NEEDED FOR  FLUID  RETENTION  . glipiZIDE (GLUCOTROL) 10 MG tablet TAKE ONE TABLET BY MOUTH TWICE DAILY *TAKE BEFORE A MEAL*  . magnesium oxide (MAG-OX 400) 400 MG tablet Take 1  tablet (400 mg total) by mouth 2 (two) times daily.  . metFORMIN (GLUCOPHAGE) 1000 MG tablet Take 1 tablet (1,000 mg total) by mouth 2 (two) times daily with a meal.  . mirtazapine (REMERON) 45 MG tablet 1/2  tablet daily at bedtime  . sitaGLIPtin (JANUVIA) 100 MG tablet Take 1 tablet (100 mg total) by mouth daily. **PLEASE CALL OFFICE FOR APPT SOON**

## 2013-09-01 ENCOUNTER — Encounter: Payer: Self-pay | Admitting: Internal Medicine

## 2013-09-01 DIAGNOSIS — R296 Repeated falls: Secondary | ICD-10-CM | POA: Insufficient documentation

## 2013-09-01 NOTE — Assessment & Plan Note (Signed)
.    Will reduce dosing to once daily and increase prn wt gain of 2 lbs overnight.

## 2013-09-01 NOTE — Assessment & Plan Note (Signed)
Advised patient to use the chair lift that her brother has provided and use walker., Patient is adamant about remaining in her home .   

## 2013-09-01 NOTE — Assessment & Plan Note (Signed)
Concerned about early chf,  But BNP is < 500.  Will stop the daily furosemide that was started today

## 2013-09-01 NOTE — Assessment & Plan Note (Signed)
Metformin resumed.  GFR improved with diuresis and control of heart rate and diastolic dysfunction.  Lab Results  Component Value Date   CREATININE 1.13* 08/30/2013   Lab Results  Component Value Date   NA 131* 08/30/2013   K 4.7 08/30/2013   CL 97 08/30/2013   CO2 21 08/30/2013

## 2013-09-01 NOTE — Assessment & Plan Note (Signed)
Secondary to medication and dietary noncompliaace complicated by extreme age, economic and social stressors .  We have resumed metformin since is is the only affordable alternative.  Samples of [name] given for 3 months.  Referral to Home health to see i they can send a CMA out to give a dose of insulin daily. Lab Results  Component Value Date   HGBA1C 11.9* 08/30/2013   Lab Results  Component Value Date   MICROALBUR 19.2* 07/07/2012

## 2013-09-03 ENCOUNTER — Telehealth: Payer: Self-pay | Admitting: Internal Medicine

## 2013-09-03 MED ORDER — BENZONATATE 200 MG PO CAPS: 200.0000 mg | ORAL_CAPSULE | Freq: Three times a day (TID) | ORAL | Status: AC | PRN

## 2013-09-03 NOTE — Telephone Encounter (Signed)
Patient is asking for respite care for two weeks either at  Facility or with  Home care advised patient of choices  And patient caregiver stated he will get back to me.

## 2013-09-03 NOTE — Telephone Encounter (Signed)
Cough medication sent to pharmacy,  What does he mean by Respite care? Is she asking for a home heatlh Rn r to go to a facility?

## 2013-09-03 NOTE — Telephone Encounter (Signed)
Patient Information:  Caller Name: Mikki Santee  Phone: [phone]  Patient: Alexandria Macias, Alexandria Macias  Gender: Female  DOB: [birth year]  Age: 80 Years  PCP: Deborra Medina (Adults only)  Office Follow Up:  Does the office need to follow up with this patient?: Yes  Instructions For The Office: Mikki Santee declines appt.  Requesting medication for cough.  Also requesting for Eladia to be placed in "Respite Care" for 1-2 weeks as she is unable to care for herself while she is sick.  RN Note:  Charlot developed a cough on 08/28/13.  Was in the office on 08/30/13.  Bob/Brother states she is not improving.  Afebrile.  Denies difficulty breathing. Has coughing spells that last 4-5 mins both day and night.  Symptoms  Reason For Call & Symptoms: Cough  Reviewed Health History In EMR: Yes  Reviewed Medications In EMR: Yes  Reviewed Allergies In EMR: Yes  Reviewed Surgeries / Procedures: Yes  Date of Onset of Symptoms: 08/28/2013  Guideline(s) Used:  Cough  Disposition Per Guideline:   See Today in Office  Reason For Disposition Reached:   Severe coughing spells   Advice Given:  N/A  Patient Refused Recommendation:  Patient Refused Care Advice  Mikki Santee declines appt.

## 2013-09-03 NOTE — Telephone Encounter (Signed)
Please advise 

## 2013-09-04 NOTE — Telephone Encounter (Signed)
Called Sharrie Rothman left message to call office Caregiver reports patient beter today advised caregiver patient needs follow up per labs.

## 2013-09-07 ENCOUNTER — Telehealth: Payer: Self-pay | Admitting: *Deleted

## 2013-09-07 NOTE — Telephone Encounter (Signed)
Patient brother called to give update on patient status and to verify which home health agency is contacting  Patient . Called [facility] and left message for Darlina Guys to return call to office.

## 2013-09-24 ENCOUNTER — Ambulatory Visit (INDEPENDENT_AMBULATORY_CARE_PROVIDER_SITE_OTHER): Payer: Medicare Other | Admitting: Podiatry

## 2013-09-24 ENCOUNTER — Telehealth: Payer: Self-pay | Admitting: Internal Medicine

## 2013-09-24 VITALS — Resp 16 | Ht 64.0 in | Wt 130.0 lb

## 2013-09-24 DIAGNOSIS — B351 Tinea unguium: Secondary | ICD-10-CM

## 2013-09-24 DIAGNOSIS — M79609 Pain in unspecified limb: Secondary | ICD-10-CM

## 2013-09-24 NOTE — Progress Notes (Signed)
She presents today chief complaint of painful elongated toenails one through 5 bilateral.  Objective: Vital signs are stable she is alert and oriented x3 pulses are palpable bilateral. Nails are thick yellow dystrophic with mycotic painful palpation.

## 2013-09-24 NOTE — Telephone Encounter (Signed)
Called patient brother and suggested he bring the form by to [facility] but do you also need a face to face for patient?

## 2013-09-24 NOTE — Telephone Encounter (Signed)
The patient's brother is needing an FL2 filled out to get a quote for a skilled nursing home for his sister. Please advise . Does he have to bring the patient in to see Dr. Derrel Nip.

## 2013-09-25 DIAGNOSIS — Z0279 Encounter for issue of other medical certificate: Secondary | ICD-10-CM

## 2013-09-25 NOTE — Telephone Encounter (Signed)
Placed form in red folder. 

## 2013-09-25 NOTE — Telephone Encounter (Signed)
No visi needed

## 2013-09-26 NOTE — Telephone Encounter (Signed)
Patient caregiver notified paperwork ready for pick up and placed up front.

## 2013-10-06 ENCOUNTER — Other Ambulatory Visit: Payer: Self-pay | Admitting: Internal Medicine

## 2013-11-20 ENCOUNTER — Telehealth: Payer: Self-pay | Admitting: Internal Medicine

## 2013-11-20 NOTE — Telephone Encounter (Signed)
Her sugars are so uncontrolled ,  Whatever we have is fine.

## 2013-11-20 NOTE — Telephone Encounter (Signed)
Mr. Alexandria Macias notified samples left up front for pickup

## 2013-11-20 NOTE — Telephone Encounter (Signed)
Spoke with Mr. Alexandria Macias, he is unsure what dose [name] pt was given. I have one box of [name] XR 100/1000 mg. Is that the correct dose for her?

## 2013-11-20 NOTE — Telephone Encounter (Signed)
Asking for samples [name].

## 2013-12-14 ENCOUNTER — Encounter: Payer: Self-pay | Admitting: Internal Medicine

## 2013-12-14 ENCOUNTER — Ambulatory Visit (INDEPENDENT_AMBULATORY_CARE_PROVIDER_SITE_OTHER): Payer: Medicare Other | Admitting: Internal Medicine

## 2013-12-14 VITALS — BP 122/68 | HR 89 | Temp 97.7°F | Resp 20 | Ht 64.0 in | Wt 130.5 lb

## 2013-12-14 DIAGNOSIS — I509 Heart failure, unspecified: Secondary | ICD-10-CM

## 2013-12-14 DIAGNOSIS — E1165 Type 2 diabetes mellitus with hyperglycemia: Secondary | ICD-10-CM

## 2013-12-14 DIAGNOSIS — IMO0002 Reserved for concepts with insufficient information to code with codable children: Secondary | ICD-10-CM

## 2013-12-14 DIAGNOSIS — IMO0001 Reserved for inherently not codable concepts without codable children: Secondary | ICD-10-CM

## 2013-12-14 DIAGNOSIS — I10 Essential (primary) hypertension: Secondary | ICD-10-CM

## 2013-12-14 DIAGNOSIS — R5383 Other fatigue: Secondary | ICD-10-CM

## 2013-12-14 DIAGNOSIS — R627 Adult failure to thrive: Secondary | ICD-10-CM

## 2013-12-14 DIAGNOSIS — R5381 Other malaise: Secondary | ICD-10-CM

## 2013-12-14 DIAGNOSIS — I5043 Acute on chronic combined systolic (congestive) and diastolic (congestive) heart failure: Secondary | ICD-10-CM

## 2013-12-14 LAB — COMPREHENSIVE METABOLIC PANEL
ALT: 12 U/L (ref 0–35)
AST: 12 U/L (ref 0–37)
Albumin: 4 g/dL (ref 3.5–5.2)
Alkaline Phosphatase: 56 U/L (ref 39–117)
BILIRUBIN TOTAL: 0.4 mg/dL (ref 0.2–1.2)
BUN: 18 mg/dL (ref 6–23)
CO2: 20 mEq/L (ref 19–32)
Calcium: 9.4 mg/dL (ref 8.4–10.5)
Chloride: 100 mEq/L (ref 96–112)
Creat: 1.05 mg/dL (ref 0.50–1.10)
Glucose, Bld: 294 mg/dL — ABNORMAL HIGH (ref 70–99)
Potassium: 4.4 mEq/L (ref 3.5–5.3)
Sodium: 136 mEq/L (ref 135–145)
Total Protein: 6.9 g/dL (ref 6.0–8.3)

## 2013-12-14 LAB — HEMOGLOBIN A1C
Hgb A1c MFr Bld: 12.3 % — ABNORMAL HIGH (ref <5.7)
Mean Plasma Glucose: 306 mg/dL — ABNORMAL HIGH (ref <117)

## 2013-12-14 MED ORDER — INSULIN NPH (HUMAN) (ISOPHANE) 100 UNIT/ML ~~LOC~~ SUSP: 15.0000 [IU] | Freq: Every day | SUBCUTANEOUS | Status: DC

## 2013-12-14 MED ORDER — METOPROLOL SUCCINATE ER 50 MG PO TB24: 50.0000 mg | ORAL_TABLET | Freq: Every day | ORAL | Status: AC

## 2013-12-14 NOTE — Assessment & Plan Note (Addendum)
Secondary to medication compliance due to inability to afford Januvia or other comparable meds and inability to self administer insulin.Cannot self administer insulin but willing to learn. Discussed  Referral to home hospice  And once in place will add NPH initially qhs. We have resumed metformin since is is the only affordable alternative. Lab Results  Component Value Date   HGBA1C 12.3* 12/14/2013   Lab Results  Component Value Date   MICROALBUR 19.2* 07/07/2012

## 2013-12-14 NOTE — Assessment & Plan Note (Addendum)
She has lost considerable wt since last visit despite use of Remeron.  DM is uncontrolled,  Appetite is poor.  Discussed goals of care.  Patient wants to remain in her home until she dies.  referral to home Hospice. Referral accepted

## 2013-12-14 NOTE — Progress Notes (Signed)
Patient ID: Alexandria Macias, female   DOB: [birth year], 80 y.o.   MRN: [id number]   Patient Active Problem List   Diagnosis Date Noted  . Failure to thrive in adult 12/14/2013  . Frequent falls 09/01/2013  . Magnesium deficiency 12/08/2012  . Edema extremities 11/28/2012  . Cough 08/25/2012  . Constipation 07/09/2012  . Vitamin D deficiency 07/09/2012  . Hypomagnesemia 07/09/2012  . Fatigue 11/28/2011  . Chronic kidney disease in type 2 diabetes mellitus 11/28/2011  . Hypertension   . Diabetes mellitus type 2, uncontrolled   . History of sick sinus syndrome     Subjective:  CC:   Chief Complaint  Patient presents with  . Follow-up  . Diabetes    HPI:   Alexandria Macias is a 80 y.o. female who presents for 3 month follow up on chronic conditions including atrial fibrillation ,  SSS s/p pacemaker,  Uncontrolled DM and hypertension .    She is reporting increased weakness and fatigue with minimal activity.  Using her walker in the house. Does not want to have a live in caregiver or transition to A/L.  No falls recently.  DM uncontrolled,  She has been cecking her blood sugars twice daily and without the Januvia, fastings have been around 250  ad post prandials have been  350 to 440 . Neither  she nor her sister Alexandria Macias can administer insulin, but she states today that she is willing to try if she can be instructed .  Sister Alexandria Macias had a nighttime caregiver at home from [place] for two weeks around [place] after dc from [place].  Per patient and brother Alexandria Macias, some were better tolerated than others, but  neither patient liked having someone stay with them at home so it was not continued.    Past Medical History  Diagnosis Date  . 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 Sick sinus syndrome Feb 2009    pacer implant  . Hx of cardiac catheterization     right and left heart  . Rosacea   . Hypertension   . Diabetes mellitus   . History of sick sinus syndrome      Past Surgical History  Procedure Laterality Date  . Abdominal hysterectomy    . Pacemaker insertion  2009  . Cholecystectomy  Oct 2010    chronic cholecystitis       The following portions of the patient's history were reviewed and updated as appropriate: Allergies, current medications, and problem list.    Review of Systems:   Patient denies headache, fevers, malaise, unintentional weight loss, skin rash, eye pain, sinus congestion and sinus pain, sore throat, dysphagia,  hemoptysis , cough, dyspnea, wheezing, chest pain, palpitations, orthopnea, edema, abdominal pain, nausea, melena, diarrhea, constipation, flank pain, dysuria, hematuria, urinary  Frequency, nocturia, numbness, tingling, seizures,  Focal weakness, Loss of consciousness,  Tremor, insomnia, depression, anxiety, and suicidal ideation.     History   Social History  . Marital Status: Widowed    Spouse Name: N/A    Number of Children: N/A  . Years of Education: N/A   Occupational History  . Not on file.   Social History Main Topics  . Smoking status: Never Smoker   . Smokeless tobacco: Never Used  . Alcohol Use: No  . Drug Use: No  . Sexual Activity: No   Other Topics Concern  . Not on file   Social History Narrative  . No narrative on file  Objective:  Filed Vitals:   12/14/13 1415  BP: 122/68  Pulse: 89  Temp: 97.7 F (36.5 C)  Resp: 20     General appearance: alert, cooperative and appears stated age Ears: normal TM's and external ear canals both ears Throat: lips, mucosa, and tongue normal; teeth and gums normal Neck: no adenopathy, no carotid bruit, supple, symmetrical, trachea midline and thyroid not enlarged, symmetric, no tenderness/mass/nodules Back: symmetric, no curvature. ROM normal. No CVA tenderness. Lungs: clear to auscultation bilaterally Heart: regular rate and rhythm, S1, S2 normal, no murmur, click, rub or gallop Abdomen: soft, non-tender; bowel sounds normal; no  masses,  no organomegaly Pulses: 2+ and symmetric Skin: Skin color, texture, turgor normal. No rashes or lesions Lymph nodes: Cervical, supraclavicular, and axillary nodes normal.  Assessment and Plan:  Failure to thrive in adult She has lost considerable wt since last visit despite use of Remeron.  DM is uncontrolled,  Appetite is poor.  Discussed goals of care.  Patient wants to remain in her home until she dies.  referral to home Hospice. Referral accepted  Diabetes mellitus type 2, uncontrolled Secondary to medication compliance due to inability to afford Januvia or other comparable meds and inability to self administer insulin.Cannot self administer insulin but willing to learn. Discussed  Referral to home hospice  And once in place will add NPH initially qhs. We have resumed metformin since is is the only affordable alternative. Lab Results  Component Value Date   HGBA1C 12.3* 12/14/2013   Lab Results  Component Value Date   MICROALBUR 19.2* 07/07/2012       Fatigue Secondary to deconditioning aggravated by unctorlled Atrial fibrillation,.  Adding Toprol XL 50 mg daily today  Hypertension Well controlled on current regimen. Renal function stable, no changes today. Lab Results  Component Value Date   CREATININE 1.05 12/14/2013    Lab Results  Component Value Date   NA 136 12/14/2013   K 4.4 12/14/2013   CL 100 12/14/2013   CO2 20 12/14/2013      Updated Medication List Outpatient Encounter Prescriptions as of 12/14/2013  Medication Sig  . digoxin (LANOXIN) 0.125 MG tablet TAKE ONE TABLET BY MOUTH EVERY OTHER DAY  . diltiazem (CARDIZEM CD) 180 MG 24 hr capsule TAKE TWO CAPSULES BY MOUTH EVERY DAY FOR HEART RATE  . furosemide (LASIX) 20 MG tablet TAKE TWO TABLETS BY MOUTH ONCE DAILY AS NEEDED FOR  FLUID  RETENTION  . glipiZIDE (GLUCOTROL) 10 MG tablet TAKE ONE TABLET BY MOUTH TWICE DAILY BEFORE MEAL(S)  . magnesium oxide (MAG-OX 400) 400 MG tablet Take 1 tablet (400 mg  total) by mouth 2 (two) times daily.  . metFORMIN (GLUCOPHAGE) 1000 MG tablet Take 1 tablet (1,000 mg total) by mouth 2 (two) times daily with a meal.  . mirtazapine (REMERON) 45 MG tablet 1/2  tablet daily at bedtime  . [DISCONTINUED] sitaGLIPtin (JANUVIA) 100 MG tablet Take 1 tablet (100 mg total) by mouth daily. **PLEASE CALL OFFICE FOR APPT SOON**  . benzonatate (TESSALON) 200 MG capsule Take 1 capsule (200 mg total) by mouth 3 (three) times daily as needed for cough.  . insulin NPH Human (HUMULIN N) 100 UNIT/ML injection Inject 0.15 mLs (15 Units total) into the skin at bedtime.  . metoprolol succinate (TOPROL-XL) 50 MG 24 hr tablet Take 1 tablet (50 mg total) by mouth daily. Take with or immediately following a meal.     Orders Placed This Encounter  Procedures  . Comprehensive metabolic panel  .  Hemoglobin A1c  . Ambulatory referral to Hospice    Return in about 3 months (around 03/16/2014) for follow up diabetes.

## 2013-12-14 NOTE — Progress Notes (Signed)
Pre-visit discussion using our clinic review tool. No additional management support is needed unless otherwise documented below in the visit note.  

## 2013-12-14 NOTE — Patient Instructions (Addendum)
Your skin itches because it is dry Your skin is dry because you are dehydrated yor are dehydrated because your diabetes is uncontrolled and causing you to lose fluids and lose weight   You can drink Glucerna for the meals you are not eating as a substitute  I an adding Toprol once daily to get your  heart rate down  I am making a referral to [facility] to help take care of you at home  This will be at no cost to you   You need to drink a minimum of 3  16 ounce servings of water daily  Eucerin skin cream  Is the best moisturizer    Refresh or Systane  Eye drops can use as needed for dry eye. Available OTC

## 2013-12-16 NOTE — Assessment & Plan Note (Signed)
Well controlled on current regimen. Renal function stable, no changes today. Lab Results  Component Value Date   CREATININE 1.05 12/14/2013    Lab Results  Component Value Date   NA 136 12/14/2013   K 4.4 12/14/2013   CL 100 12/14/2013   CO2 20 12/14/2013

## 2013-12-16 NOTE — Assessment & Plan Note (Signed)
Secondary to deconditioning aggravated by unctorlled Atrial fibrillation,.  Adding Toprol XL 50 mg daily today

## 2013-12-24 ENCOUNTER — Encounter: Payer: Self-pay | Admitting: Podiatry

## 2013-12-24 ENCOUNTER — Other Ambulatory Visit: Payer: Self-pay | Admitting: *Deleted

## 2013-12-24 ENCOUNTER — Ambulatory Visit (INDEPENDENT_AMBULATORY_CARE_PROVIDER_SITE_OTHER): Payer: Medicare Other | Admitting: Podiatry

## 2013-12-24 DIAGNOSIS — M79673 Pain in unspecified foot: Secondary | ICD-10-CM

## 2013-12-24 DIAGNOSIS — B351 Tinea unguium: Secondary | ICD-10-CM

## 2013-12-24 DIAGNOSIS — M79609 Pain in unspecified limb: Secondary | ICD-10-CM

## 2013-12-24 DIAGNOSIS — Q828 Other specified congenital malformations of skin: Secondary | ICD-10-CM

## 2013-12-24 DIAGNOSIS — E1149 Type 2 diabetes mellitus with other diabetic neurological complication: Secondary | ICD-10-CM

## 2013-12-24 MED ORDER — DIGOXIN 125 MCG PO TABS: ORAL_TABLET | ORAL | Status: AC

## 2013-12-24 NOTE — Progress Notes (Signed)
She presents today chief complaint of painful elongated toenails.  Objective: Nails are thick yellow dystrophic onychomycotic painful palpation. Porokeratosis sub-fifth metatarsal right foot  Assessment: Pain in limb secondary to onychomycosis. Porokeratosis sub-fifth metatarsal right foot. Diabetes mellitus.  Plan: Debridement all reactive hyperkeratosis debridement painful nails 1 through 5 bilateral.

## 2013-12-31 ENCOUNTER — Telehealth: Payer: Self-pay | Admitting: *Deleted

## 2013-12-31 NOTE — Telephone Encounter (Signed)
Spoke with [name] to advise of MDs message.  [name] states pt and family requests to change Humilin N to morning instead of at bedtime.  Please advise

## 2013-12-31 NOTE — Telephone Encounter (Signed)
Please tell them to reduce the dose of N to 10 units at bedtime

## 2013-12-31 NOTE — Telephone Encounter (Signed)
Fine,  10 units N in the morning

## 2013-12-31 NOTE — Telephone Encounter (Signed)
Alexandria Macias called states pts family reported on Thursday pts blood sugar was 219 prior to taking Humilin N 15units at bedtime and the next morning pts blood sugar was 85.  That evening pts blood sugar was 290 before giving Humilin N 15 units at bedtime and the next morning her blood sugar was 62.  The family has decided to discontinue the Insulin Humilin N   Please advise

## 2013-12-31 NOTE — Telephone Encounter (Signed)
Ok to switch to 15 units of NPH in the morning instead

## 2014-01-01 NOTE — Telephone Encounter (Signed)
15, as stated in my reply yesterday afternoon.

## 2014-01-01 NOTE — Telephone Encounter (Signed)
Spoke with [name] from Hospice.  She needs clarification as to whether pt is to be on 10 units of N or 15 units of NPH in the morning.  Please advise

## 2014-01-01 NOTE — Telephone Encounter (Signed)
Spoke with [name] from [facility], advised of MDs message

## 2014-01-14 ENCOUNTER — Telehealth: Payer: Self-pay | Admitting: Internal Medicine

## 2014-01-14 NOTE — Telephone Encounter (Signed)
The patient's sugars are uncontrolled in the evening. Why did they reqeuxst the insulin shot be changed to morning?  If the hospice RN cannot give it at 5 pm, see if she can give it at lunch time  15 units

## 2014-01-14 NOTE — Telephone Encounter (Signed)
Confirm they have been giving the insulin at 11 am?  Ok to continue but increase dose to 18 units daily at 11 am

## 2014-01-14 NOTE — Telephone Encounter (Signed)
Patient caregiver stated that  CBG= 62 in the morning that patient eats breakfast at 4 am and lunch is 9 to 9.30 am and dinner is around 4 pm with patient taking insulin at 5 PM cbg's were falling very low during the night, notified that patient should receive 15 units at lunch< stated was being given at 11 am.

## 2014-01-15 NOTE — Telephone Encounter (Signed)
Patient caregiver notified to increase dose to 18 units

## 2014-02-02 ENCOUNTER — Other Ambulatory Visit: Payer: Self-pay | Admitting: Internal Medicine

## 2014-03-02 ENCOUNTER — Other Ambulatory Visit: Payer: Self-pay | Admitting: Internal Medicine

## 2014-03-11 ENCOUNTER — Telehealth: Payer: Self-pay | Admitting: Internal Medicine

## 2014-03-11 MED ORDER — SITAGLIPTIN PHOSPHATE 100 MG PO TABS: ORAL_TABLET | ORAL | Status: AC

## 2014-03-11 NOTE — Telephone Encounter (Signed)
Ok to refill,  Refill sent  

## 2014-03-11 NOTE — Telephone Encounter (Signed)
Patient notified

## 2014-03-11 NOTE — Telephone Encounter (Signed)
OK to refill Januvia Patient last OV 12/14/13 last script stated to please schedule OV soon on 02/04/14.

## 2014-03-11 NOTE — Telephone Encounter (Signed)
Pts brother called in and stated pt has an appt on 9/21 but is needing a refill of januvia 100 mg. He stated he would like a call back from a nurse about some medication.

## 2014-03-18 ENCOUNTER — Telehealth: Payer: Self-pay | Admitting: Internal Medicine

## 2014-03-18 ENCOUNTER — Ambulatory Visit: Payer: Medicare Other | Admitting: Internal Medicine

## 2014-03-18 ENCOUNTER — Ambulatory Visit (INDEPENDENT_AMBULATORY_CARE_PROVIDER_SITE_OTHER): Admitting: Internal Medicine

## 2014-03-18 ENCOUNTER — Encounter: Payer: Self-pay | Admitting: Internal Medicine

## 2014-03-18 VITALS — BP 126/58 | HR 56 | Temp 98.4°F | Resp 12 | Ht 64.0 in | Wt 139.5 lb

## 2014-03-18 DIAGNOSIS — E1165 Type 2 diabetes mellitus with hyperglycemia: Secondary | ICD-10-CM

## 2014-03-18 DIAGNOSIS — E538 Deficiency of other specified B group vitamins: Secondary | ICD-10-CM

## 2014-03-18 DIAGNOSIS — R600 Localized edema: Secondary | ICD-10-CM

## 2014-03-18 DIAGNOSIS — Z23 Encounter for immunization: Secondary | ICD-10-CM

## 2014-03-18 DIAGNOSIS — R296 Repeated falls: Secondary | ICD-10-CM

## 2014-03-18 DIAGNOSIS — R609 Edema, unspecified: Secondary | ICD-10-CM

## 2014-03-18 DIAGNOSIS — IMO0002 Reserved for concepts with insufficient information to code with codable children: Secondary | ICD-10-CM

## 2014-03-18 DIAGNOSIS — IMO0001 Reserved for inherently not codable concepts without codable children: Secondary | ICD-10-CM | POA: Diagnosis not present

## 2014-03-18 DIAGNOSIS — Z9181 History of falling: Secondary | ICD-10-CM

## 2014-03-18 LAB — CBC WITH DIFFERENTIAL/PLATELET
BASOS ABS: 0 10*3/uL (ref 0.0–0.1)
Basophils Relative: 0.3 % (ref 0.0–3.0)
Eosinophils Absolute: 0.3 10*3/uL (ref 0.0–0.7)
Eosinophils Relative: 4.2 % (ref 0.0–5.0)
HCT: 38.4 % (ref 36.0–46.0)
Hemoglobin: 12.6 g/dL (ref 12.0–15.0)
LYMPHS PCT: 15.2 % (ref 12.0–46.0)
Lymphs Abs: 1.1 10*3/uL (ref 0.7–4.0)
MCHC: 32.9 g/dL (ref 30.0–36.0)
MCV: 96.6 fl (ref 78.0–100.0)
MONOS PCT: 8.1 % (ref 3.0–12.0)
Monocytes Absolute: 0.6 10*3/uL (ref 0.1–1.0)
Neutro Abs: 5.3 10*3/uL (ref 1.4–7.7)
Neutrophils Relative %: 72.2 % (ref 43.0–77.0)
Platelets: 196 10*3/uL (ref 150.0–400.0)
RBC: 3.97 Mil/uL (ref 3.87–5.11)
RDW: 15 % (ref 11.5–15.5)
WBC: 7.4 10*3/uL (ref 4.0–10.5)

## 2014-03-18 LAB — COMPREHENSIVE METABOLIC PANEL
ALBUMIN: 4.1 g/dL (ref 3.5–5.2)
ALK PHOS: 47 U/L (ref 39–117)
ALT: 11 U/L (ref 0–35)
AST: 20 U/L (ref 0–37)
BUN: 26 mg/dL — AB (ref 6–23)
CO2: 21 mEq/L (ref 19–32)
Calcium: 9.6 mg/dL (ref 8.4–10.5)
Chloride: 98 mEq/L (ref 96–112)
Creatinine, Ser: 1.5 mg/dL — ABNORMAL HIGH (ref 0.4–1.2)
GFR: 35.44 mL/min — ABNORMAL LOW (ref >60.00)
GLUCOSE: 229 mg/dL — AB (ref 70–99)
Potassium: 4.7 mEq/L (ref 3.5–5.1)
Sodium: 135 mEq/L (ref 135–145)
Total Bilirubin: 0.5 mg/dL (ref 0.2–1.2)
Total Protein: 7.6 g/dL (ref 6.0–8.3)

## 2014-03-18 LAB — HEMOGLOBIN A1C: Hgb A1c MFr Bld: 8.2 % — ABNORMAL HIGH (ref 4.6–6.5)

## 2014-03-18 LAB — VITAMIN B12: Vitamin B-12: 71 pg/mL — ABNORMAL LOW (ref 211–911)

## 2014-03-18 NOTE — Telephone Encounter (Signed)
Error: Stated they are feeling better and would like to come in.

## 2014-03-18 NOTE — Telephone Encounter (Signed)
Pt called in and stated she is sick and not able to get outta bed and stated she wont be able to make her appt today..cancel?

## 2014-03-18 NOTE — Progress Notes (Signed)
Pre-visit discussion using our clinic review tool. No additional management support is needed unless otherwise documented below in the visit note.  

## 2014-03-18 NOTE — Patient Instructions (Signed)
I want to add a second dose of insulin in the morning.  Start with 6 units  Continue the 18 units in the afternoon  Eat a snack before bedtime toast and peanut butter is fine   You can take 2 jentadueto tablets daily in place of Januvia 100 mg daily   I will have the hospice nurse give you B12 shots every week

## 2014-03-18 NOTE — Progress Notes (Signed)
Patient ID: Alexandria Macias, female   DOB: 02/24/1918, 80 y.o.   MRN: [id number]   Patient Active Problem List   Diagnosis Date Noted  . Failure to thrive in adult 12/14/2013  . Frequent falls 09/01/2013  . Edema extremities 11/28/2012  . Cough 08/25/2012  . Constipation 07/09/2012  . Vitamin D deficiency 07/09/2012  . Hypomagnesemia 07/09/2012  . Fatigue 11/28/2011  . Chronic kidney disease in type 2 diabetes mellitus 11/28/2011  . Hypertension   . Diabetes mellitus type 2, uncontrolled   . History of sick sinus syndrome     Subjective:  CC:   Chief Complaint  Patient presents with  . Follow-up    medication refills  . Diabetes  . Nausea    Burped and regergitated break fast.  . Fatigue    weak and wants to sleep alot.    HPI:   Alexandria Macias is a 80 y.o. female who presents for Follow up on chronic conditions including poorly controlled diabetes mellitus type 2, now on insulin,  Heart failure with diastolic dysfunction and chronic atrila fibrillation, hypertension , and frequent falls.  She is accompanied by brother , Mikki Santee.  Hospice has  Been sending a nurse out twice weekly. Patient is now self administering NPH insulin once daily , as best i can tell, around 11 AM .  Evening doses of NPH were resulting in hypoglycemic events in the morning so they were advised to stop, Her sister is checking her blood sugars twice daily : between  1 :00 and 4 pm which is as close to  2 hours after her lunch as I can tell,  And around 11 am whic his after patient wakes up.  Her  Post prandials have been over 200 but under 300.  fastings have been 125 and 165  .  Cc: fatigue and generalized weakness.  Using a walker  .  No recent falls. wangts to reamin in her own home until she dies.  Denies pain.      Past Medical History  Diagnosis Date  . 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 Sick sinus syndrome Feb 2009    pacer implant  . Hx of cardiac catheterization     right  and left heart  . Rosacea   . Hypertension   . Diabetes mellitus   . History of sick sinus syndrome     Past Surgical History  Procedure Laterality Date  . Abdominal hysterectomy    . Pacemaker insertion  2009  . Cholecystectomy  Oct 2010    chronic cholecystitis       The following portions of the patient's history were reviewed and updated as appropriate: Allergies, current medications, and problem list.    Review of Systems:   Patient denies headache, fevers, malaise, unintentional weight loss, skin rash, eye pain, sinus congestion and sinus pain, sore throat, dysphagia,  hemoptysis , cough, dyspnea, wheezing, chest pain, palpitations, orthopnea, edema, abdominal pain, nausea, melena, diarrhea, constipation, flank pain, dysuria, hematuria, urinary  Frequency, nocturia, numbness, tingling, seizures,  Focal weakness, Loss of consciousness,  Tremor, insomnia, depression, anxiety, and suicidal ideation.     History   Social History  . Marital Status: Widowed    Spouse Name: N/A    Number of Children: N/A  . Years of Education: N/A   Occupational History  . Not on file.   Social History Main Topics  . Smoking status: Never Smoker   . Smokeless tobacco: Never  Used  . Alcohol Use: No  . Drug Use: No  . Sexual Activity: No   Other Topics Concern  . Not on file   Social History Narrative  . No narrative on file    Objective:  Filed Vitals:   03/18/14 1027  BP: 126/58  Pulse: 56  Temp: 98.4 F (36.9 C)  Resp: 12     General appearance: alert, cooperative and appears stated age Ears: normal TM's and external ear canals both ears Throat: lips, mucosa, and tongue normal; teeth and gums normal Neck: no adenopathy, no carotid bruit, supple, symmetrical, trachea midline and thyroid not enlarged, symmetric, no tenderness/mass/nodules Back: symmetric, no curvature. ROM normal. No CVA tenderness. Lungs: clear to auscultation bilaterally Heart: regular rate and  rhythm, S1, S2 normal, no murmur, click, rub or gallop Abdomen: soft, non-tender; bowel sounds normal; no masses,  no organomegaly Pulses: 2+ and symmetric Skin: Skin color, texture, turgor normal. No rashes or lesions Lymph nodes: Cervical, supraclavicular, and axillary nodes normal.  Assessment and Plan:  Diabetes mellitus type 2, uncontrolled Improved with addition of insulin. Currently using 18 units of NPH in the afternoon  Adding a morning dose of 6 units .  Patient is up-to-date on eye exams and foot exam is normal today. Patient has microalbuminuria but prior attempts at adding ACE/ARB resulted in hypotension  Patient is intolerant of  statin therapy for CAD risk reduction and on ACE/ARB for reduction in proteinuria.   Lab Results  Component Value Date   HGBA1C 8.2* 03/18/2014   Lab Results  Component Value Date   MICROALBUR 19.2* 07/07/2012      Hypomagnesemia Continue mg supplements  Edema extremities Continue furosemide dosing to once daily and increase prn wt gain of 2 lbs overnight.       Frequent falls Advised patient to use the chair lift that her brother has provided and use walker., Patient is adamant about remaining in her home .     Updated Medication List Outpatient Encounter Prescriptions as of 03/18/2014  Medication Sig  . benzonatate (TESSALON) 200 MG capsule Take 1 capsule (200 mg total) by mouth 3 (three) times daily as needed for cough.  . digoxin (LANOXIN) 0.125 MG tablet TAKE ONE TABLET BY MOUTH EVERY OTHER DAY  . diltiazem (CARDIZEM CD) 180 MG 24 hr capsule TAKE TWO CAPSULES BY MOUTH EVERY DAY FOR HEART RATE  . furosemide (LASIX) 20 MG tablet TAKE TWO TABLETS BY MOUTH ONCE DAILY AS NEEDED FOR  FLUID  RETENTION  . glipiZIDE (GLUCOTROL) 10 MG tablet TAKE ONE TABLET BY MOUTH TWICE DAILY BEFORE MEAL(S)  . insulin NPH Human (HUMULIN N,NOVOLIN N) 100 UNIT/ML injection 6 units in the AM and 18 units in the afternoon  . magnesium oxide (MAG-OX 400) 400  MG tablet Take 1 tablet (400 mg total) by mouth 2 (two) times daily.  . metFORMIN (GLUCOPHAGE) 1000 MG tablet Take 1 tablet (1,000 mg total) by mouth 2 (two) times daily with a meal.  . metoprolol succinate (TOPROL-XL) 50 MG 24 hr tablet Take 1 tablet (50 mg total) by mouth daily. Take with or immediately following a meal.  . sitaGLIPtin (JANUVIA) 100 MG tablet TAKE ONE TABLET BY MOUTH ONCE DAILy  . [DISCONTINUED] insulin NPH Human (HUMULIN N) 100 UNIT/ML injection Inject 0.15 mLs (15 Units total) into the skin at bedtime.  . [DISCONTINUED] insulin NPH Human (HUMULIN N,NOVOLIN N) 100 UNIT/ML injection Inject 18 Units into the skin at bedtime.  . mirtazapine (REMERON) 45 MG  tablet 1/2  tablet daily at bedtime     Orders Placed This Encounter  Procedures  . Hemoglobin A1c  . Comprehensive metabolic panel  . CBC with Differential  . Folate RBC  . B12    Return in about 3 months (around 06/17/2014).

## 2014-03-19 ENCOUNTER — Telehealth: Payer: Self-pay | Admitting: Internal Medicine

## 2014-03-19 LAB — FOLATE RBC: RBC FOLATE: 514 ng/mL (ref >280)

## 2014-03-19 MED ORDER — CYANOCOBALAMIN 1000 MCG/ML IJ SOLN: INTRAMUSCULAR | Status: AC

## 2014-03-19 NOTE — Telephone Encounter (Signed)
b12 rx sent to [facility].  Please write order for weekly b12 injections of 1 mL  IM injection send to hospice

## 2014-03-19 NOTE — Telephone Encounter (Signed)
Hospice called and stated MD mentioned at [place] having hospice give B 12 injections to patient. Hospice is calling for script and order script to be sent to [facility] and order sent to hospice Please advise.

## 2014-03-20 ENCOUNTER — Encounter: Payer: Self-pay | Admitting: *Deleted

## 2014-03-20 ENCOUNTER — Encounter: Payer: Self-pay | Admitting: Internal Medicine

## 2014-03-20 MED ORDER — INSULIN NPH (HUMAN) (ISOPHANE) 100 UNIT/ML ~~LOC~~ SUSP: SUBCUTANEOUS | Status: AC

## 2014-03-20 NOTE — Assessment & Plan Note (Signed)
Continue mg supplements

## 2014-03-20 NOTE — Assessment & Plan Note (Addendum)
Improved with addition of insulin. Currently using 18 units of NPH in the afternoon  Adding a morning dose of 6 units .  Patient is up-to-date on eye exams and foot exam is normal today. Patient has microalbuminuria but prior attempts at adding ACE/ARB resulted in hypotension  Patient is intolerant of  statin therapy for CAD risk reduction and on ACE/ARB for reduction in proteinuria.   Lab Results  Component Value Date   HGBA1C 8.2* 03/18/2014   Lab Results  Component Value Date   MICROALBUR 19.2* 07/07/2012

## 2014-03-20 NOTE — Assessment & Plan Note (Signed)
Advised patient to use the chair lift that her brother has provided and use walker., Patient is adamant about remaining in her home .

## 2014-03-20 NOTE — Telephone Encounter (Signed)
Order sheet faxed to Hospice along with script to [facility] notified hospice nurse.

## 2014-03-20 NOTE — Assessment & Plan Note (Signed)
Continue furosemide dosing to once daily and increase prn wt gain of 2 lbs overnight.

## 2014-03-21 ENCOUNTER — Encounter: Payer: Self-pay | Admitting: *Deleted

## 2014-03-29 ENCOUNTER — Ambulatory Visit: Payer: Medicare Other | Admitting: Internal Medicine

## 2014-03-30 ENCOUNTER — Other Ambulatory Visit: Payer: Self-pay | Admitting: Internal Medicine

## 2014-04-01 ENCOUNTER — Emergency Department: Payer: Self-pay | Admitting: Emergency Medicine

## 2014-04-01 ENCOUNTER — Ambulatory Visit: Payer: Medicare Other | Admitting: Podiatry

## 2014-04-01 ENCOUNTER — Telehealth: Payer: Self-pay

## 2014-04-01 LAB — URINALYSIS, COMPLETE
BACTERIA: NONE SEEN
Bilirubin,UR: NEGATIVE
Blood: NEGATIVE
Glucose,UR: NEGATIVE mg/dL (ref 0–75)
Ketone: NEGATIVE
LEUKOCYTE ESTERASE: NEGATIVE
Nitrite: NEGATIVE
PH: 6 (ref 4.5–8.0)
PROTEIN: NEGATIVE
RBC,UR: 2 /HPF (ref 0–5)
SPECIFIC GRAVITY: 1.009 (ref 1.003–1.030)
Squamous Epithelial: <1
WBC UR: <1 /HPF (ref 0–5)

## 2014-04-01 LAB — TROPONIN I: Troponin-I: <0.02 ng/mL

## 2014-04-01 LAB — COMPREHENSIVE METABOLIC PANEL
ALK PHOS: 51 U/L
Albumin: 3.5 g/dL (ref 3.4–5.0)
Anion Gap: 7 (ref 7–16)
BUN: 26 mg/dL — AB (ref 7–18)
Bilirubin,Total: 0.5 mg/dL (ref 0.2–1.0)
CO2: 26 mmol/L (ref 21–32)
CREATININE: 1.61 mg/dL — AB (ref 0.60–1.30)
Calcium, Total: 8.7 mg/dL (ref 8.5–10.1)
Chloride: 103 mmol/L (ref 98–107)
EGFR (Non-African Amer.): 32 — ABNORMAL LOW
GFR CALC AF AMER: 38 — AB
Glucose: 148 mg/dL — ABNORMAL HIGH (ref 65–99)
OSMOLALITY: 279 (ref 275–301)
POTASSIUM: 4.4 mmol/L (ref 3.5–5.1)
SGOT(AST): 18 U/L (ref 15–37)
SGPT (ALT): 17 U/L
Sodium: 136 mmol/L (ref 136–145)
Total Protein: 7.1 g/dL (ref 6.4–8.2)

## 2014-04-01 LAB — CBC
HCT: 40.3 % (ref 35.0–47.0)
HGB: 12.7 g/dL (ref 12.0–16.0)
MCH: 31 pg (ref 26.0–34.0)
MCHC: 31.4 g/dL — ABNORMAL LOW (ref 32.0–36.0)
MCV: 99 fL (ref 80–100)
Platelet: 200 10*3/uL (ref 150–440)
RBC: 4.09 10*6/uL (ref 3.80–5.20)
RDW: 14.5 % (ref 11.5–14.5)
WBC: 9.6 10*3/uL (ref 3.6–11.0)

## 2014-04-01 NOTE — Telephone Encounter (Signed)
Please have her stop the glipizide and continue the insulin

## 2014-04-01 NOTE — Telephone Encounter (Signed)
[name] with hospice called and wanted to inform Dr.Tullo that the patient had to go to the ER for hypoglycemia this am (she became unresponsive at home) (blood sugar at the ER was 120, however, that was after pepsi and a piece of candy)   [name], RN with hospice - [phone]

## 2014-04-02 NOTE — Telephone Encounter (Signed)
Then by all means the glipizide should be stopped as originally ordered, and the insulin .  Resume insulin  for BGS > 200 otherwise do not give any

## 2014-04-02 NOTE — Telephone Encounter (Signed)
Notified Hospice nurse to stop both medications only give 10 units of insulin for CBG more than 200.

## 2014-04-02 NOTE — Telephone Encounter (Signed)
It depends on whether the event occurred fasting or not,  Since they did not give me enough information with the first call they will have to provide  more detail.

## 2014-04-02 NOTE — Telephone Encounter (Signed)
Nurse stated that patient is taking in very little food, and that it is barely enough to sustain her . Nurse stated that if this patient CBG drops below 80 she becomes lethargic and at 70 she can be unresponsive. Nurse tried Glucerna for patient between meals and gave her diarrhea and patient will not try it again. Please advise. Only DX Ed could find for unresponsive state was low blood sugar.

## 2014-04-02 NOTE — Telephone Encounter (Signed)
Hospice notified of changes to Insulin regimen and to stop glipizide. Hospice ask could they use the regimen the ED suggested of decreasing night time insulin to 10 units leaving the morning dose and the glipizide the same Please advise?

## 2014-04-02 NOTE — Telephone Encounter (Signed)
Spoke with Meryl Crutch, pts sister, advised of MDs message.  She verbalized understanding

## 2014-04-05 ENCOUNTER — Telehealth: Payer: Self-pay | Admitting: Internal Medicine

## 2014-04-05 NOTE — Telephone Encounter (Signed)
OK TO RESUME GLIPIZIDE AT 5 MG ONCE DAILY WITH A MEAL.  DO NOT GIVE IF SHE DOES NOT EAT

## 2014-04-05 NOTE — Telephone Encounter (Signed)
Hospice called for order to restart glipizide patient CBG 311 today and 199 on 04/04/14 Please advise if oK.

## 2014-04-05 NOTE — Telephone Encounter (Signed)
Lisa at hospice notified.

## 2014-04-11 ENCOUNTER — Telehealth: Payer: Self-pay | Admitting: Internal Medicine

## 2014-04-11 ENCOUNTER — Other Ambulatory Visit: Payer: Self-pay | Admitting: Internal Medicine

## 2014-04-11 ENCOUNTER — Telehealth: Payer: Self-pay | Admitting: *Deleted

## 2014-04-11 NOTE — Telephone Encounter (Signed)
I do not understand the request for insulin change in red folder,  Relion is not a type of insulin I can write for. I would like to help make the insulin more affordable but they need to clarify what my choices are for long acting insulin

## 2014-04-11 NOTE — Telephone Encounter (Signed)
Pt states he was at [facility], was told pt's insulin was not covered/would be expensive. States [facility] offered some alternatives, he was not sure what they were. Advised him to have [place] fax [place] the cheaper options so that Dr. Derrel Nip could change accordingly.  verbalized understanding

## 2014-04-12 NOTE — Telephone Encounter (Signed)
Verified with dr. Gilford Rile that the relion is the same insulin and patient does not need script according to pharmacy.

## 2014-04-12 NOTE — Telephone Encounter (Signed)
Patient is only taking insulin when CBG's are over 200, patient brother just wanted to know if all the insulins the same could he get just the brand that is $24.88. Since your note stated the insulins the same Novolin N [facility] brand.

## 2014-04-22 ENCOUNTER — Ambulatory Visit (INDEPENDENT_AMBULATORY_CARE_PROVIDER_SITE_OTHER): Payer: Medicare Other | Admitting: Podiatry

## 2014-04-22 DIAGNOSIS — B351 Tinea unguium: Secondary | ICD-10-CM

## 2014-04-22 DIAGNOSIS — M79676 Pain in unspecified toe(s): Secondary | ICD-10-CM

## 2014-04-22 NOTE — Progress Notes (Signed)
Presents today chief complaint of painful elongated toenails.  Objective: Pulses are palpable bilateral nails are thick, yellow dystrophic onychomycosis and painful palpation.   Assessment: Onychomycosis with pain in limb.  Plan: Treatment of nails in thickness and length as covered service secondary to pain.  

## 2014-04-27 ENCOUNTER — Other Ambulatory Visit: Payer: Self-pay | Admitting: Internal Medicine

## 2014-05-18 ENCOUNTER — Other Ambulatory Visit: Payer: Self-pay | Admitting: Internal Medicine

## 2014-05-31 ENCOUNTER — Emergency Department: Payer: Self-pay | Admitting: Emergency Medicine

## 2014-05-31 LAB — BASIC METABOLIC PANEL
Anion Gap: 10 (ref 7–16)
BUN: 25 mg/dL — AB (ref 7–18)
CO2: 26 mmol/L (ref 21–32)
CREATININE: 1.51 mg/dL — AB (ref 0.60–1.30)
Calcium, Total: 9.1 mg/dL (ref 8.5–10.1)
Chloride: 100 mmol/L (ref 98–107)
GFR CALC AF AMER: 41 — AB
GFR CALC NON AF AMER: 34 — AB
Glucose: 223 mg/dL — ABNORMAL HIGH (ref 65–99)
Osmolality: 283 (ref 275–301)
Potassium: 4.6 mmol/L (ref 3.5–5.1)
Sodium: 136 mmol/L (ref 136–145)

## 2014-05-31 LAB — CBC
HCT: 39.3 % (ref 35.0–47.0)
HGB: 12.5 g/dL (ref 12.0–16.0)
MCH: 30.5 pg (ref 26.0–34.0)
MCHC: 31.8 g/dL — AB (ref 32.0–36.0)
MCV: 96 fL (ref 80–100)
Platelet: 198 10*3/uL (ref 150–440)
RBC: 4.09 10*6/uL (ref 3.80–5.20)
RDW: 14.4 % (ref 11.5–14.5)
WBC: 8.7 10*3/uL (ref 3.6–11.0)

## 2014-05-31 LAB — URINALYSIS, COMPLETE
Bacteria: NONE SEEN
Bilirubin,UR: NEGATIVE
Blood: NEGATIVE
Leukocyte Esterase: NEGATIVE
NITRITE: NEGATIVE
PH: 5 (ref 4.5–8.0)
Protein: 30
RBC,UR: 1 /HPF (ref 0–5)
Specific Gravity: 1.015 (ref 1.003–1.030)

## 2014-05-31 LAB — TROPONIN I: Troponin-I: <0.02 ng/mL

## 2014-07-22 ENCOUNTER — Ambulatory Visit: Payer: Medicare Other | Admitting: Podiatry

## 2014-07-22 ENCOUNTER — Ambulatory Visit: Payer: Medicare Other

## 2014-09-27 DEATH — deceased

## 2014-11-27 IMAGING — CT CT HEAD WITHOUT CONTRAST
3 series · 18 of 30 positions shown, 20 images · non-contrast
Comparison: 04/22/2009.

CLINICAL DATA: Acute onset altered mental status. Patient does not
follow commands and is unresponsive.

EXAM:
CT HEAD WITHOUT CONTRAST
TECHNIQUE: Contiguous axial images were obtained from the base of the skull
through the vertex without intravenous contrast.

[Series 2: soft tissue · axial · 0.42mm/px · z∈[-61,+44]mm · 8 of 29 slices shown, 10 images]
[im 4/29  brain]
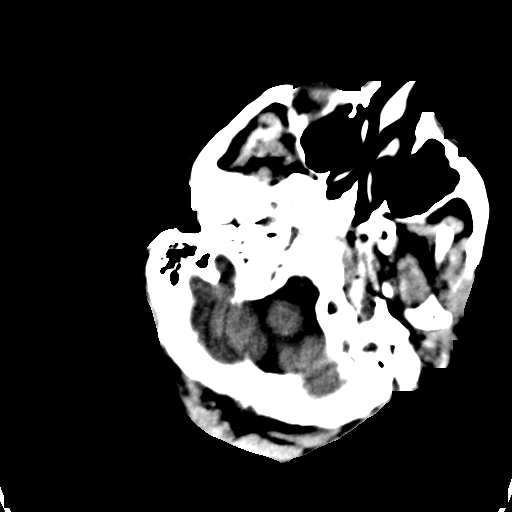
[im 4/29  bone]
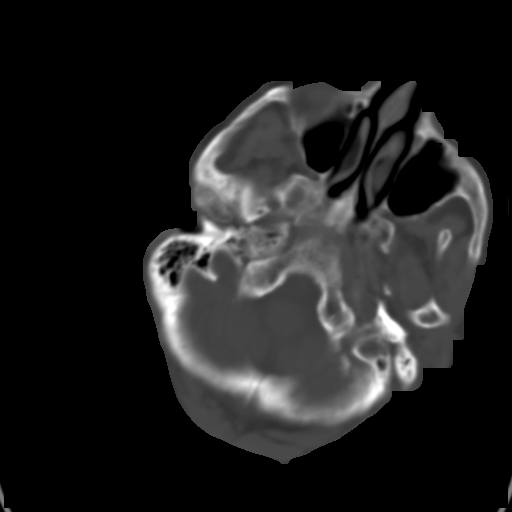
[im 7/29  brain]
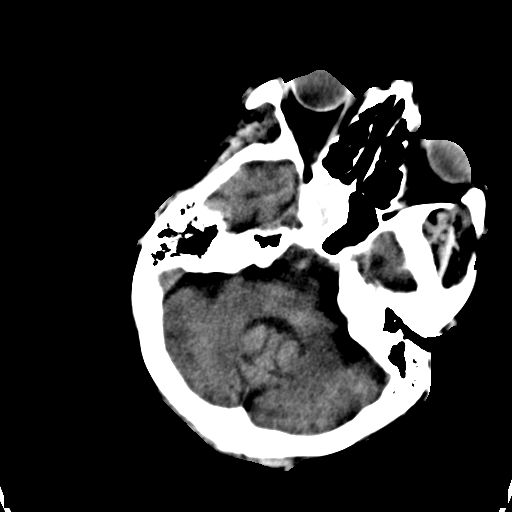
[im 10/29  brain]
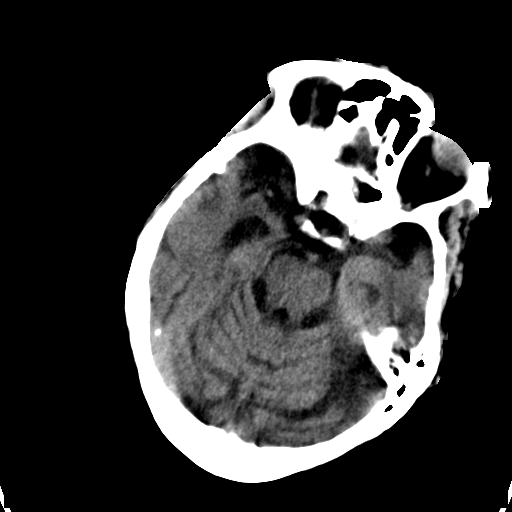
[im 13/29  brain]
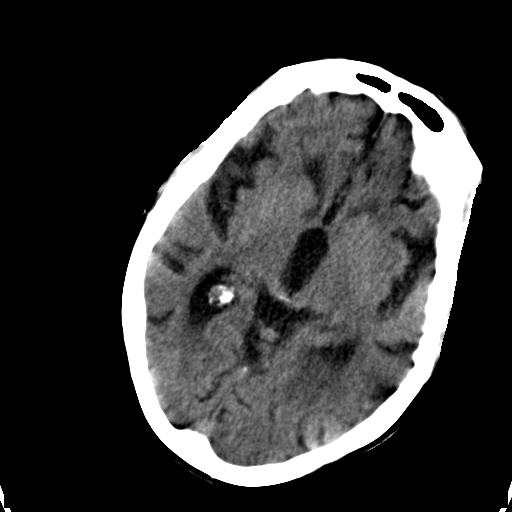
[im 16/29  brain]
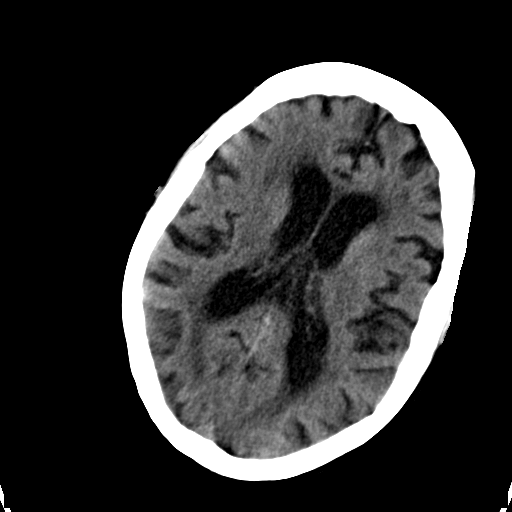
[im 16/29  bone]
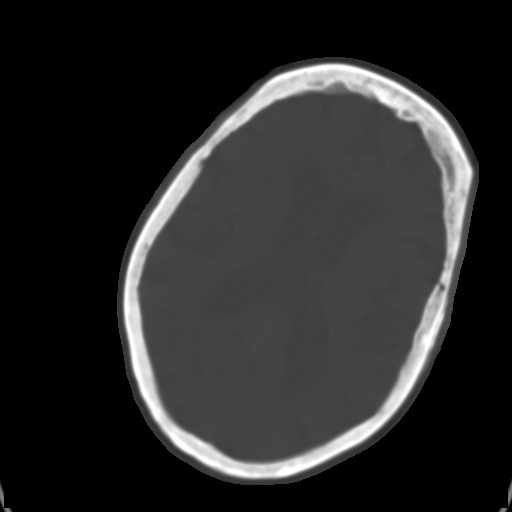
[im 19/29  brain]
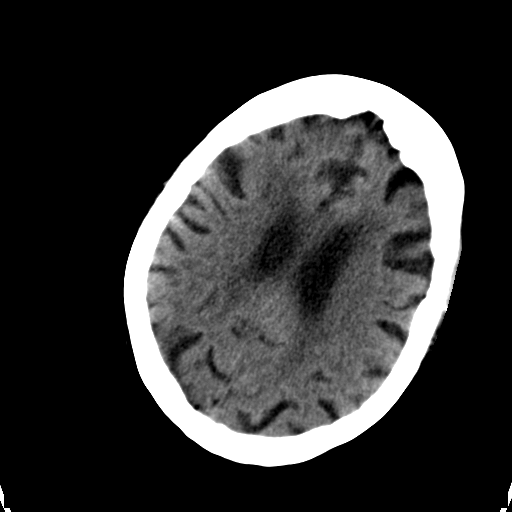
[im 22/29  brain]
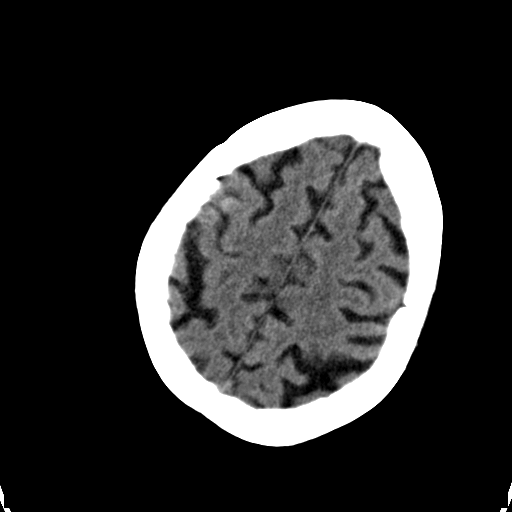
[im 25/29  brain]
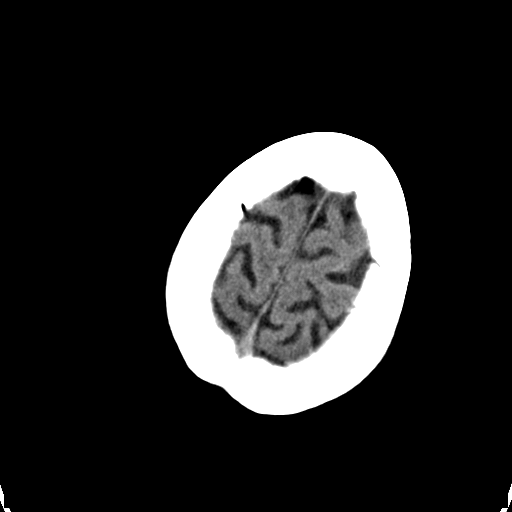

[Series 4: soft tissue recon · axial · 0.39mm/px · z∈[-32,+68]mm · 8 of 29 slices shown]
[im 4/29  brain]
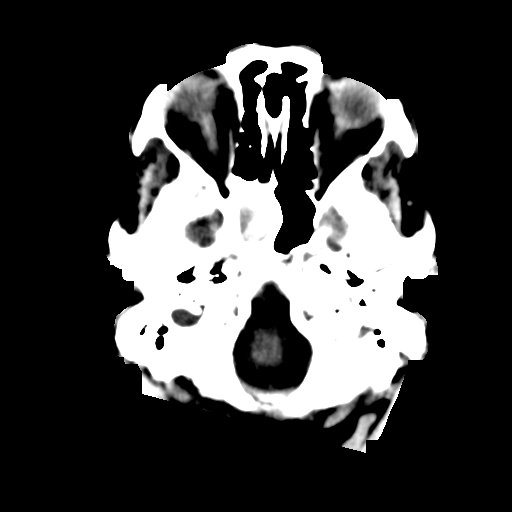
[im 7/29  brain]
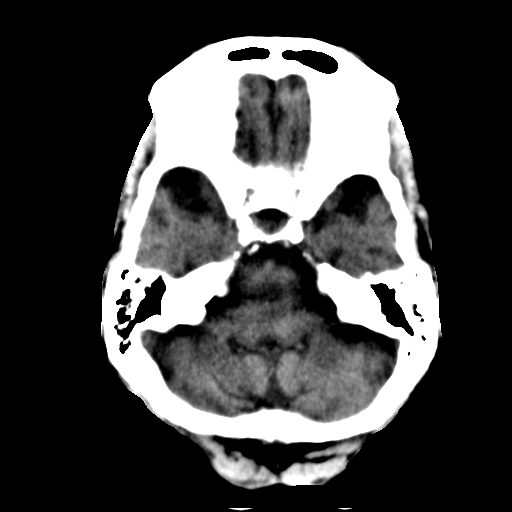
[im 10/29  brain]
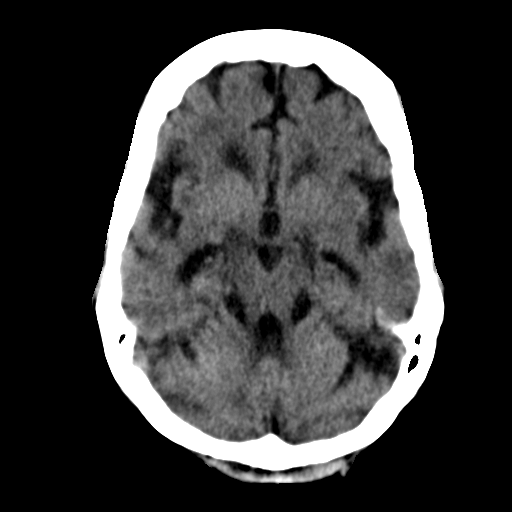
[im 13/29  brain]
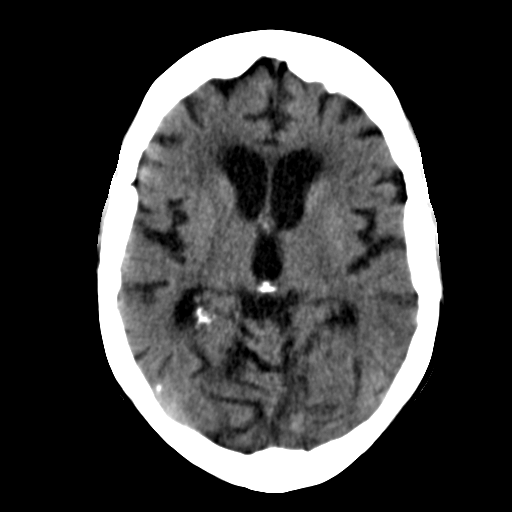
[im 16/29  brain]
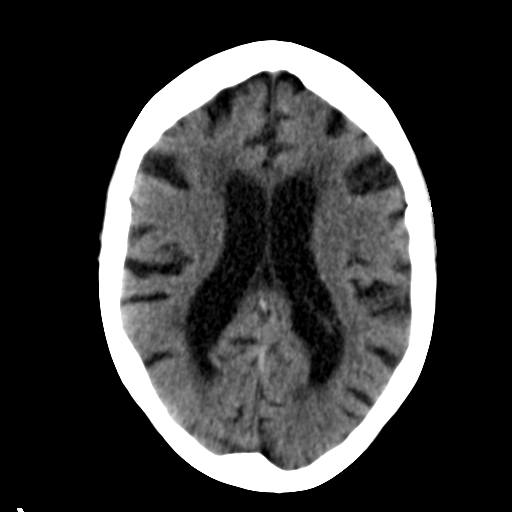
[im 19/29  brain]
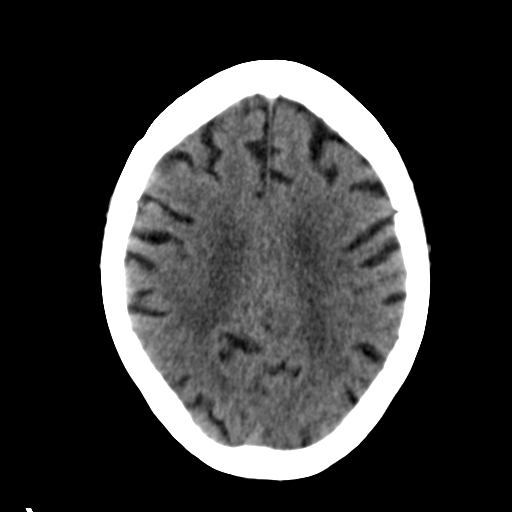
[im 22/29  brain]
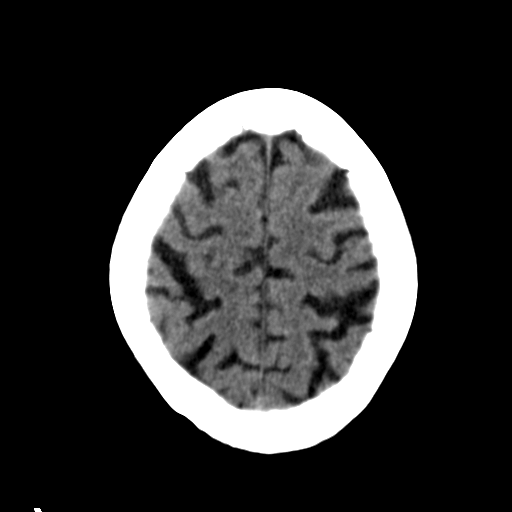
[im 25/29  brain]
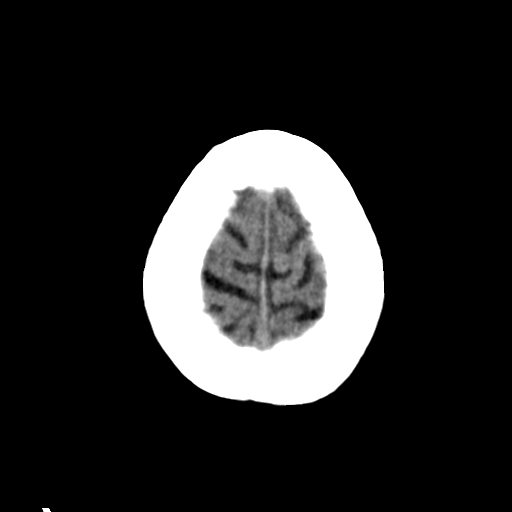

[Series 5: bone recon · axial · 0.42mm/px · z∈[-19,-4]mm · 2 of 29 slices shown]
[im 4/29  bone]
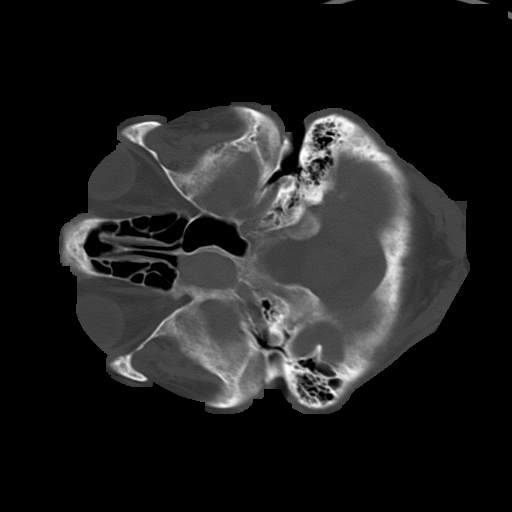
[im 7/29  bone]
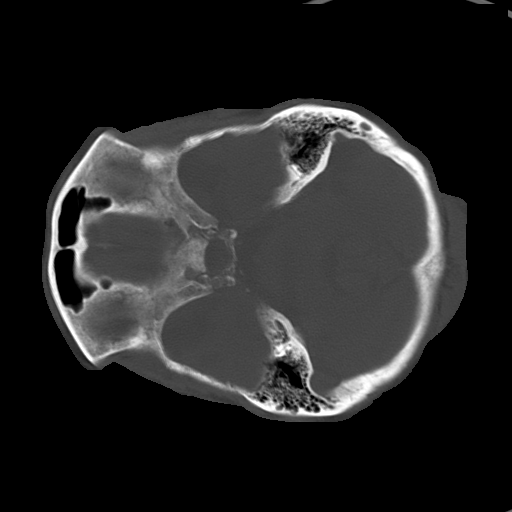

[18 of 30 positions shown; findings below may reference images not displayed]

FINDINGS: No evidence of an acute infarct, acute hemorrhage, mass lesion, mass
effect or hydrocephalus. Atrophy. Moderate periventricular low
attenuation. There is complete opacification of the right sphenoid
sinus, as on 04/22/2009. No air-fluid levels in the remaining
visualized paranasal sinuses. Mastoid air cells are grossly clear.
IMPRESSION: 1. No acute intracranial abnormality.
2. Atrophy and chronic microvascular white matter ischemic changes.
